# Patient Record
Sex: Female | Born: 1955
Health system: Southern US, Community
[De-identification: ages and names within clinical notes are randomized; demographics above are authoritative.]

## PROBLEM LIST (undated history)

## (undated) DIAGNOSIS — R569 Unspecified convulsions: Secondary | ICD-10-CM

## (undated) DIAGNOSIS — G4730 Sleep apnea, unspecified: Secondary | ICD-10-CM

## (undated) DIAGNOSIS — M359 Systemic involvement of connective tissue, unspecified: Secondary | ICD-10-CM

## (undated) DIAGNOSIS — I10 Essential (primary) hypertension: Secondary | ICD-10-CM

## (undated) HISTORY — PX: HERNIA REPAIR: SHX51

## (undated) HISTORY — PX: TUBAL LIGATION: SHX77

## (undated) HISTORY — DX: Sleep apnea, unspecified: G47.30

## (undated) HISTORY — PX: OTHER SURGICAL HISTORY: SHX169

## (undated) HISTORY — DX: Systemic involvement of connective tissue, unspecified: M35.9

## (undated) HISTORY — PX: CHOLECYSTECTOMY: SHX55

---

## 1998-04-12 ENCOUNTER — Encounter: Admission: RE | Admit: 1998-04-12 | Discharge: 1998-04-12 | Payer: Self-pay | Admitting: *Deleted

## 1998-07-23 ENCOUNTER — Ambulatory Visit (HOSPITAL_COMMUNITY): Admission: RE | Admit: 1998-07-23 | Discharge: 1998-07-23 | Payer: Self-pay | Admitting: Obstetrics and Gynecology

## 1998-07-23 ENCOUNTER — Encounter: Payer: Self-pay | Admitting: Obstetrics and Gynecology

## 1999-02-24 ENCOUNTER — Other Ambulatory Visit: Admission: RE | Admit: 1999-02-24 | Discharge: 1999-02-24 | Payer: Self-pay | Admitting: Obstetrics and Gynecology

## 1999-04-26 ENCOUNTER — Ambulatory Visit (HOSPITAL_COMMUNITY): Admission: RE | Admit: 1999-04-26 | Discharge: 1999-04-26 | Payer: Self-pay | Admitting: Obstetrics and Gynecology

## 1999-04-26 ENCOUNTER — Encounter (INDEPENDENT_AMBULATORY_CARE_PROVIDER_SITE_OTHER): Payer: Self-pay

## 2000-01-26 ENCOUNTER — Other Ambulatory Visit: Admission: RE | Admit: 2000-01-26 | Discharge: 2000-01-26 | Payer: Self-pay | Admitting: Obstetrics and Gynecology

## 2000-02-17 ENCOUNTER — Ambulatory Visit (HOSPITAL_COMMUNITY): Admission: RE | Admit: 2000-02-17 | Discharge: 2000-02-17 | Payer: Self-pay

## 2000-11-15 ENCOUNTER — Encounter: Payer: Self-pay | Admitting: Obstetrics and Gynecology

## 2000-11-15 ENCOUNTER — Ambulatory Visit (HOSPITAL_COMMUNITY): Admission: RE | Admit: 2000-11-15 | Discharge: 2000-11-15 | Payer: Self-pay | Admitting: Obstetrics and Gynecology

## 2001-02-21 ENCOUNTER — Inpatient Hospital Stay (HOSPITAL_COMMUNITY): Admission: EM | Admit: 2001-02-21 | Discharge: 2001-02-26 | Payer: Self-pay

## 2001-02-21 ENCOUNTER — Encounter: Payer: Self-pay | Admitting: Emergency Medicine

## 2001-04-04 ENCOUNTER — Other Ambulatory Visit: Admission: RE | Admit: 2001-04-04 | Discharge: 2001-04-04 | Payer: Self-pay | Admitting: Obstetrics and Gynecology

## 2001-05-20 ENCOUNTER — Ambulatory Visit (HOSPITAL_COMMUNITY): Admission: RE | Admit: 2001-05-20 | Discharge: 2001-05-20 | Payer: Self-pay | Admitting: *Deleted

## 2001-05-20 ENCOUNTER — Encounter: Payer: Self-pay | Admitting: *Deleted

## 2001-05-31 ENCOUNTER — Encounter: Payer: Self-pay | Admitting: *Deleted

## 2001-05-31 ENCOUNTER — Encounter: Admission: RE | Admit: 2001-05-31 | Discharge: 2001-05-31 | Payer: Self-pay | Admitting: *Deleted

## 2002-05-29 ENCOUNTER — Ambulatory Visit (HOSPITAL_COMMUNITY): Admission: RE | Admit: 2002-05-29 | Discharge: 2002-05-29 | Payer: Self-pay | Admitting: Obstetrics

## 2002-05-29 ENCOUNTER — Encounter: Payer: Self-pay | Admitting: Obstetrics

## 2002-06-18 ENCOUNTER — Encounter: Payer: Self-pay | Admitting: Cardiovascular Disease

## 2002-06-18 ENCOUNTER — Ambulatory Visit (HOSPITAL_COMMUNITY): Admission: RE | Admit: 2002-06-18 | Discharge: 2002-06-18 | Payer: Self-pay | Admitting: *Deleted

## 2002-07-30 ENCOUNTER — Ambulatory Visit (HOSPITAL_COMMUNITY): Admission: RE | Admit: 2002-07-30 | Discharge: 2002-07-30 | Payer: Self-pay | Admitting: Interventional Cardiology

## 2002-07-30 ENCOUNTER — Encounter: Payer: Self-pay | Admitting: Interventional Cardiology

## 2002-09-19 ENCOUNTER — Emergency Department (HOSPITAL_COMMUNITY): Admission: EM | Admit: 2002-09-19 | Discharge: 2002-09-19 | Payer: Self-pay | Admitting: *Deleted

## 2002-09-19 ENCOUNTER — Encounter: Payer: Self-pay | Admitting: *Deleted

## 2002-10-11 ENCOUNTER — Encounter: Payer: Self-pay | Admitting: *Deleted

## 2002-10-11 ENCOUNTER — Ambulatory Visit (HOSPITAL_COMMUNITY): Admission: RE | Admit: 2002-10-11 | Discharge: 2002-10-11 | Payer: Self-pay | Admitting: *Deleted

## 2003-07-30 ENCOUNTER — Ambulatory Visit (HOSPITAL_COMMUNITY): Admission: RE | Admit: 2003-07-30 | Discharge: 2003-07-30 | Payer: Self-pay | Admitting: Obstetrics

## 2004-01-03 ENCOUNTER — Emergency Department (HOSPITAL_COMMUNITY): Admission: EM | Admit: 2004-01-03 | Discharge: 2004-01-03 | Payer: Self-pay | Admitting: Family Medicine

## 2004-07-19 ENCOUNTER — Emergency Department (HOSPITAL_COMMUNITY): Admission: EM | Admit: 2004-07-19 | Discharge: 2004-07-19 | Payer: Self-pay | Admitting: Family Medicine

## 2004-08-11 ENCOUNTER — Ambulatory Visit (HOSPITAL_COMMUNITY): Admission: RE | Admit: 2004-08-11 | Discharge: 2004-08-11 | Payer: Self-pay | Admitting: Obstetrics

## 2005-12-27 ENCOUNTER — Ambulatory Visit (HOSPITAL_COMMUNITY): Admission: RE | Admit: 2005-12-27 | Discharge: 2005-12-27 | Payer: Self-pay | Admitting: Internal Medicine

## 2006-07-13 ENCOUNTER — Emergency Department (HOSPITAL_COMMUNITY): Admission: EM | Admit: 2006-07-13 | Discharge: 2006-07-13 | Payer: Self-pay | Admitting: Emergency Medicine

## 2006-07-13 IMAGING — CR DG CERVICAL SPINE COMPLETE 4+V
5 series · 5 of 5 positions shown · non-contrast
Comparison: 10/11/02.

CLINICAL DATA: 50 year-old-female with trauma and posterior neck pain. 
 CERVICAL SPINE ? _ VIEW:
 AP, lateral, oblique and odontoid views of the cervical spine were obtained.

[w c-spine lat]
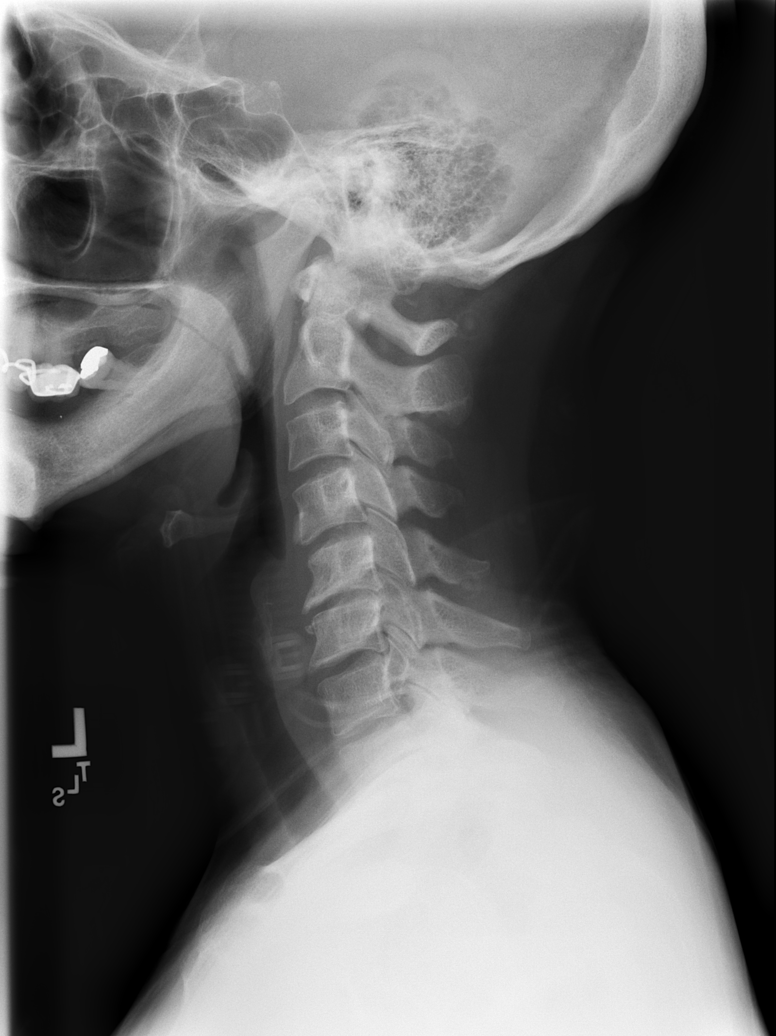

[w c-spine oblique (1 of 2)]
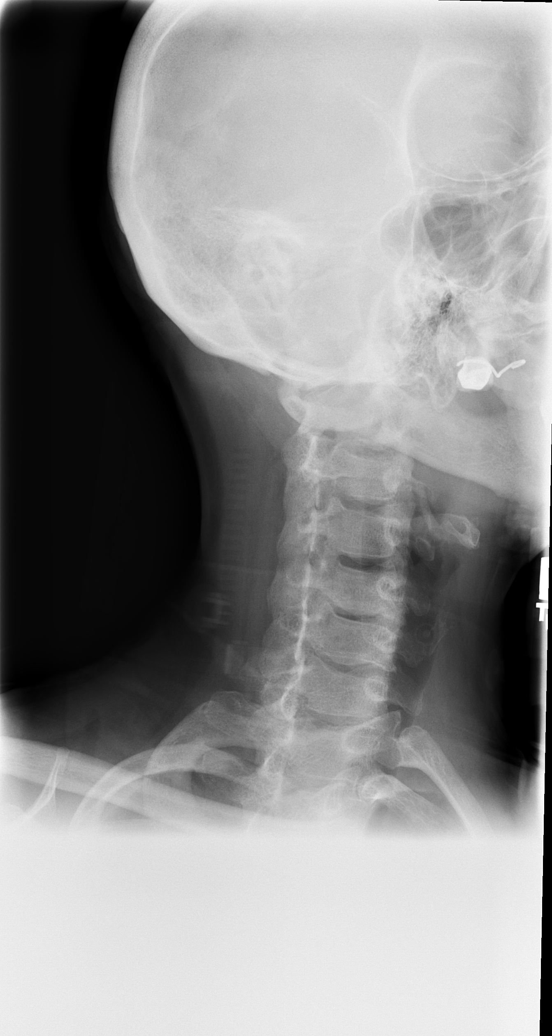

[w c-spine oblique (2 of 2)]
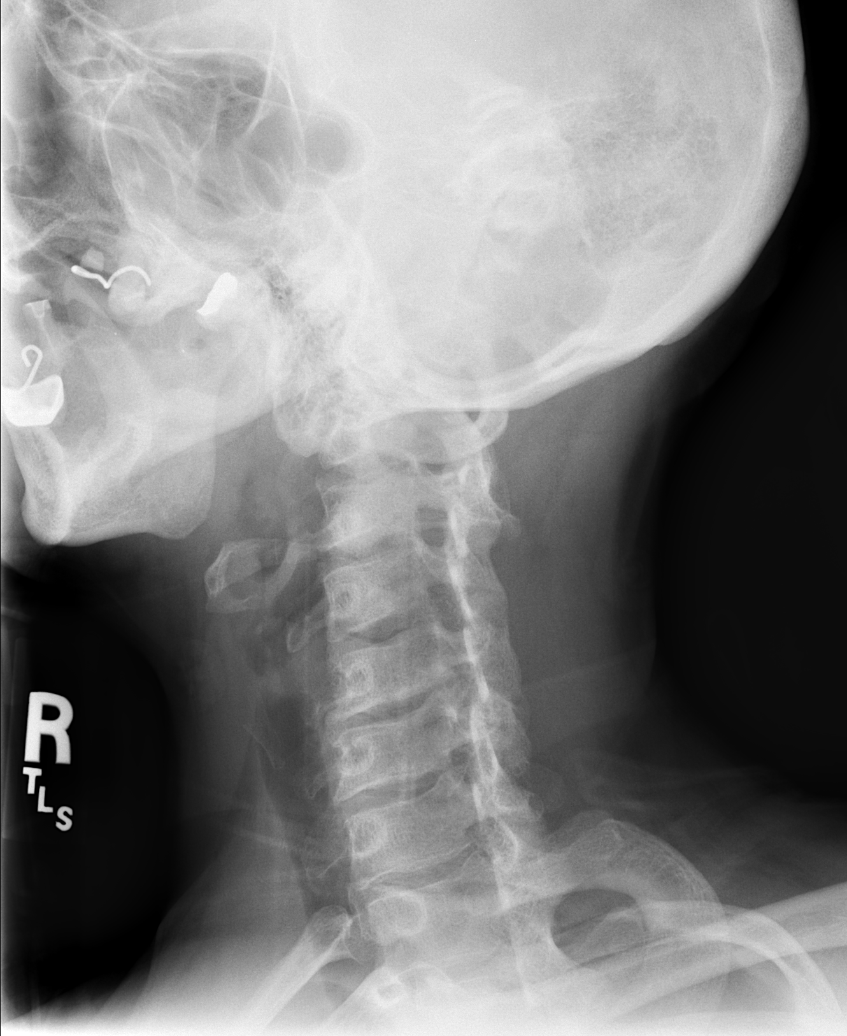

[w c-spine a.p.]
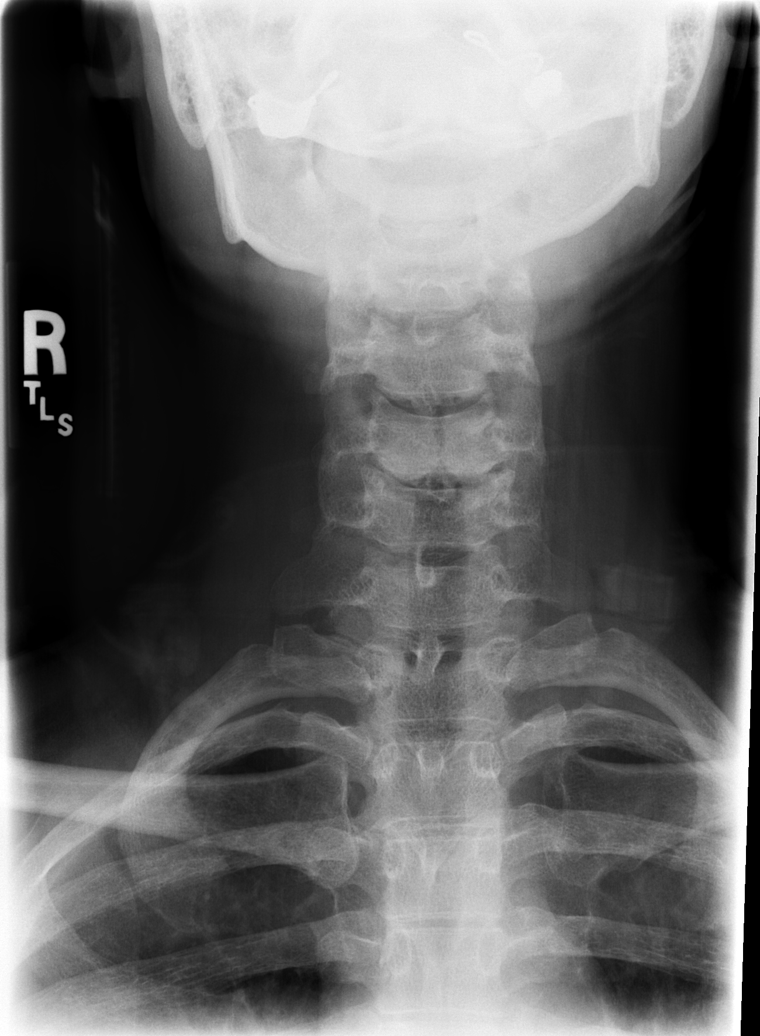

[w c-spine odontoid]
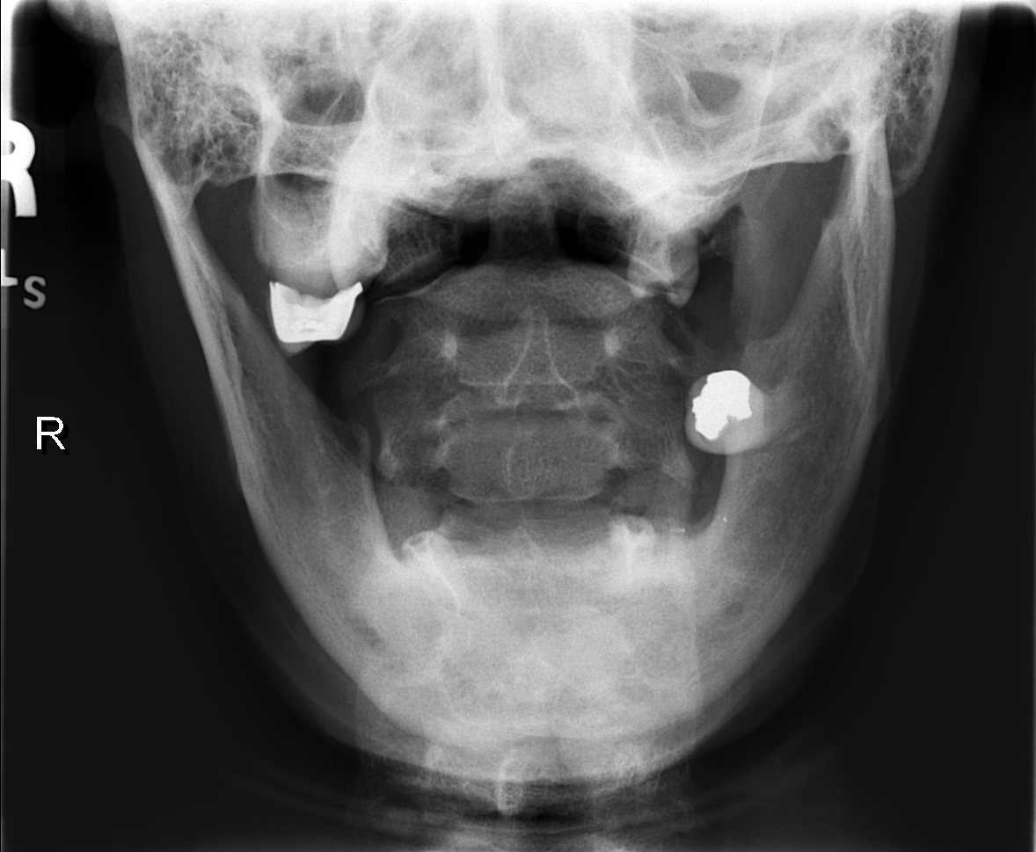

[5 of 5 positions shown; findings below may reference images not displayed]

FINDINGS: Alignment of the cervical spine is unchanged with degenerative endplate changes at C5-C6.  Prevertebral soft tissues are within normal limits.  Possible bony encroachment along the left foramen at C5-C6.
IMPRESSION: Stable cervical spine series with degenerative changes at C5-C6.  No acute bone abnormalities.

## 2006-07-23 ENCOUNTER — Encounter: Admission: RE | Admit: 2006-07-23 | Discharge: 2006-07-23 | Payer: Self-pay | Admitting: Internal Medicine

## 2006-07-23 IMAGING — US US ABDOMEN COMPLETE
1 series · 14 of 25 positions shown · non-contrast
Comparison: None.

ABDOMEN ULTRASOUND:

CLINICAL DATA: Elevated LFTs
TECHNIQUE: Complete abdominal ultrasound examination was performed including
evaluation of the liver, gallbladder, bile ducts, pancreas, kidneys, spleen,
IVC, and abdominal aorta.

[Series 1: unknown · 14 of 58 slices shown]
[im 1/58]
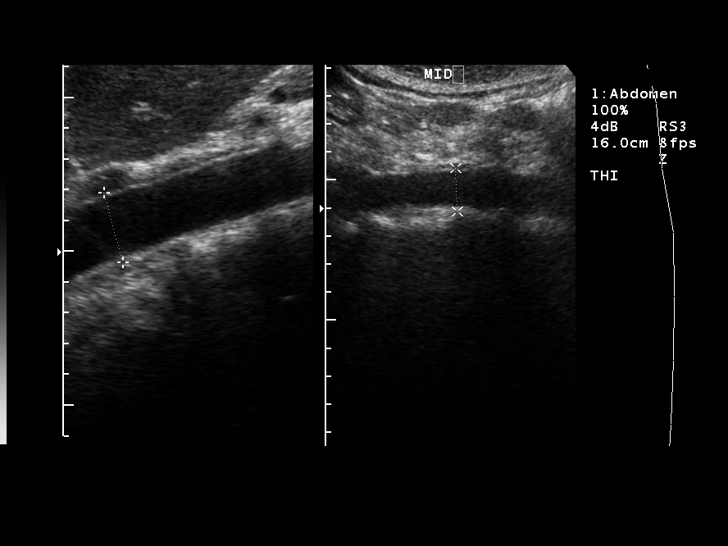
[im 5/58]
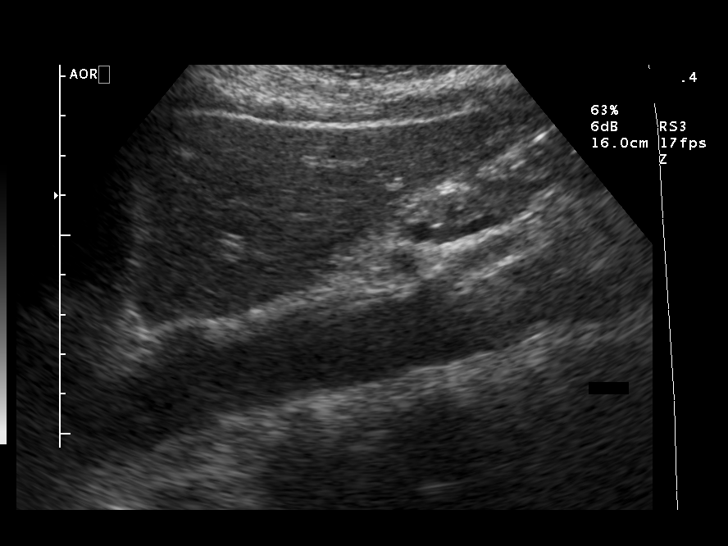
[im 10/58]
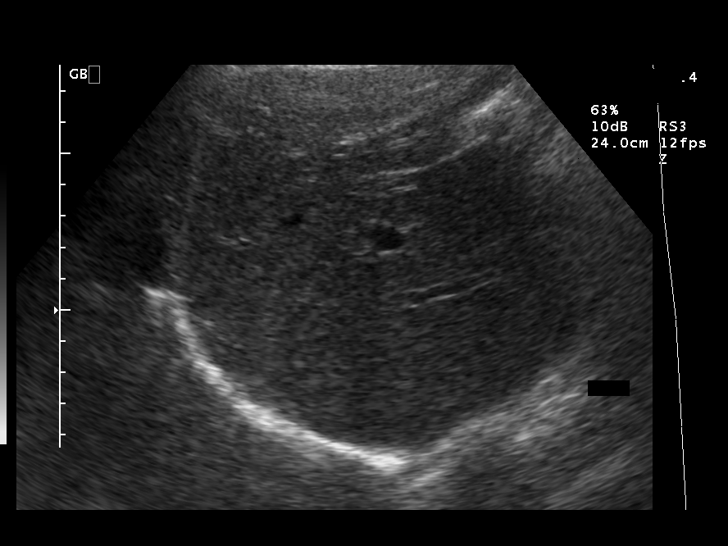
[im 15/58]
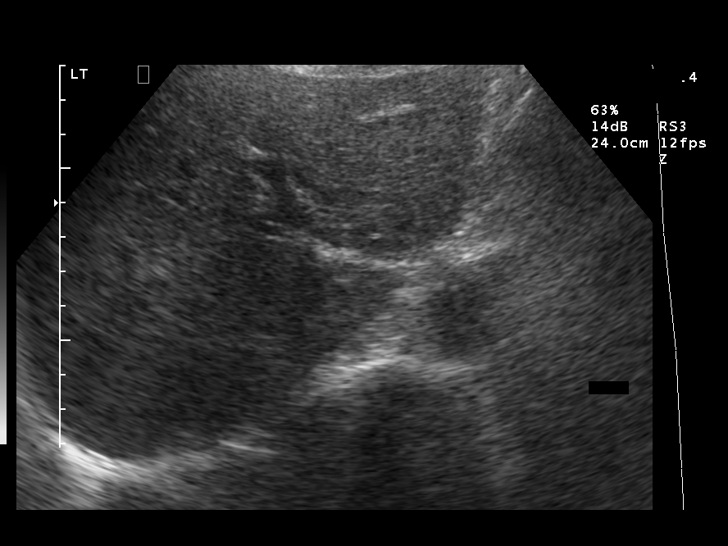
[im 20/58]
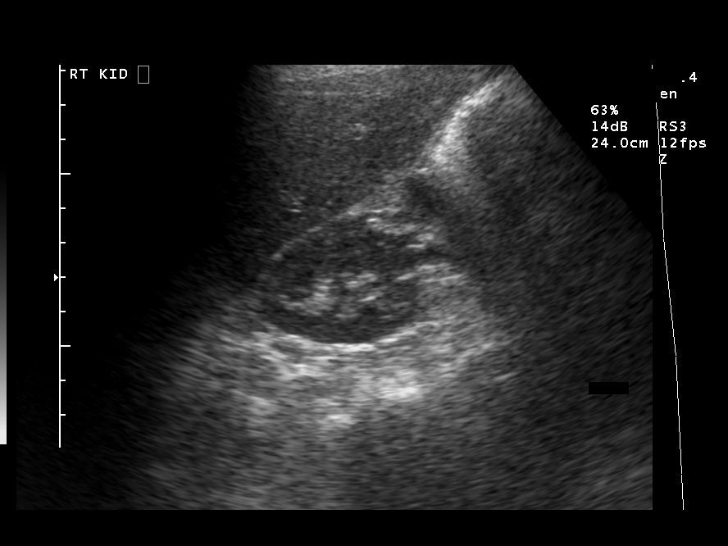
[im 22/58]
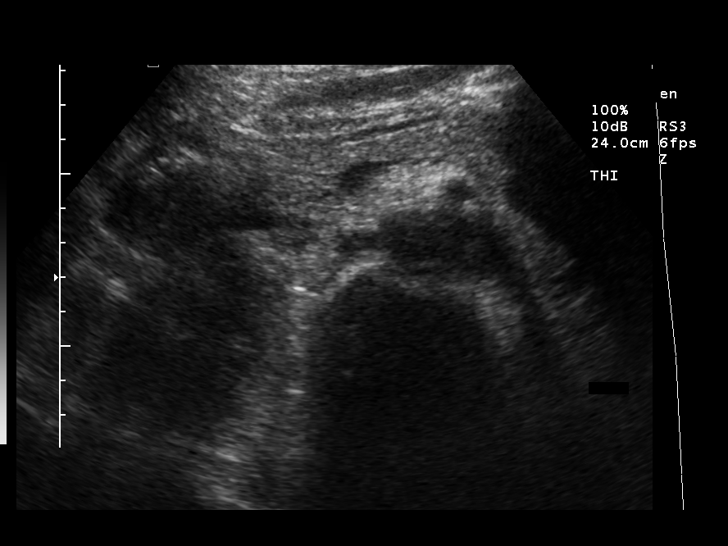
[im 27/58]
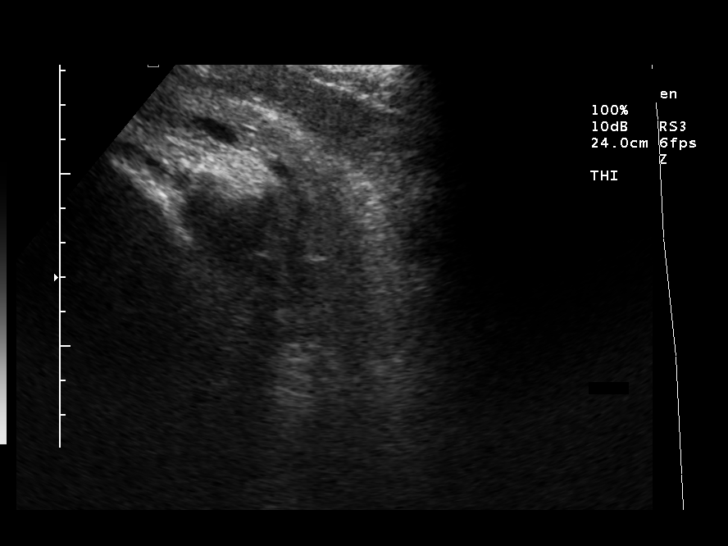
[im 31/58]
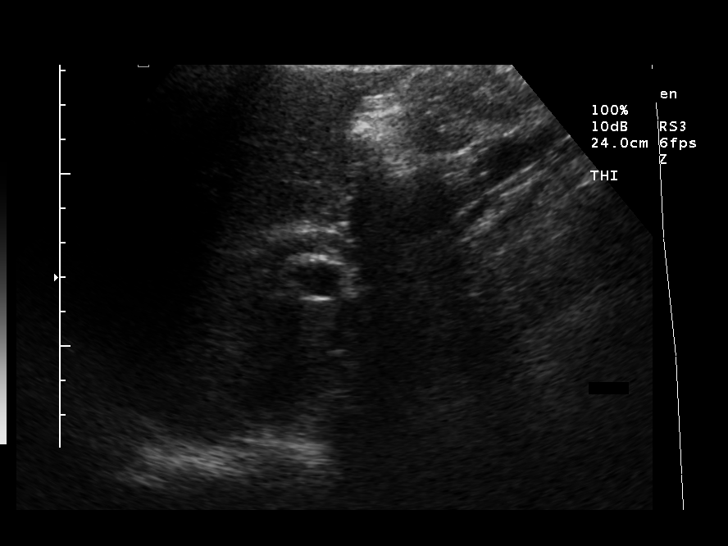
[im 36/58]
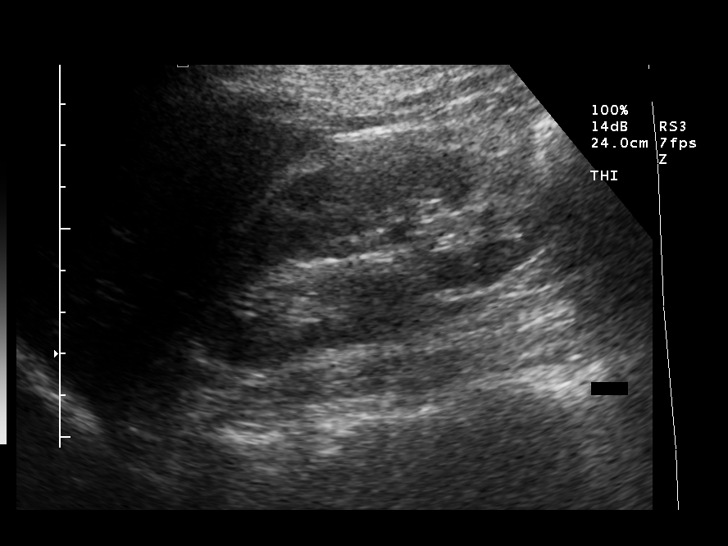
[im 39/58]
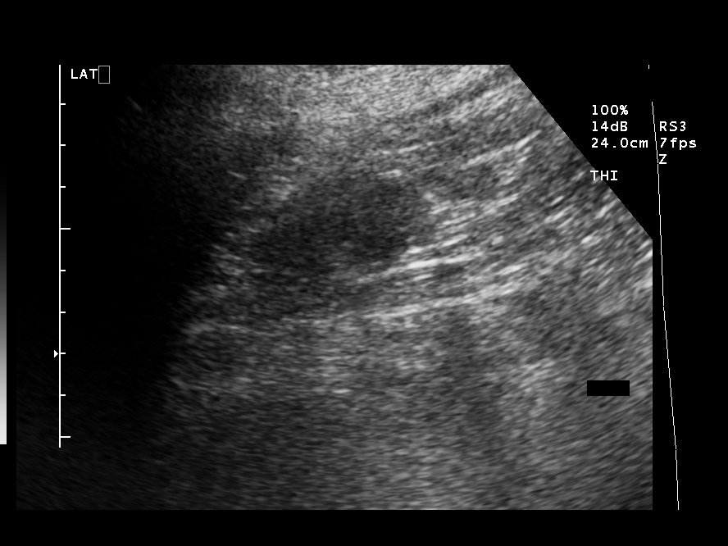
[im 43/58]
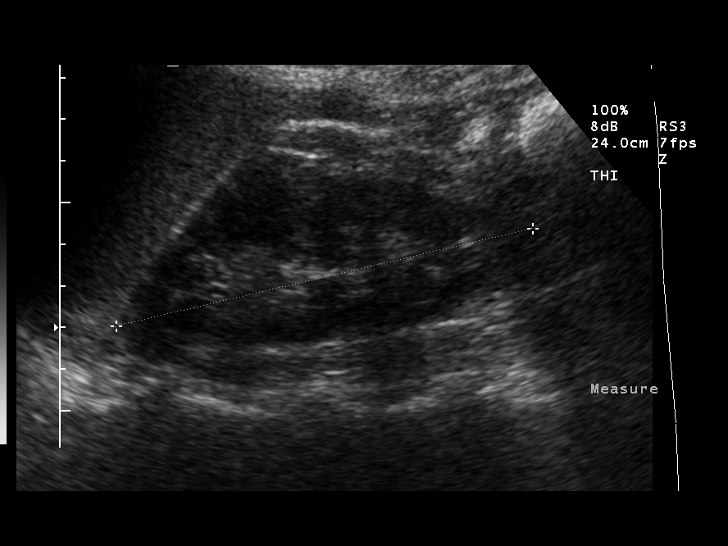
[im 48/58]
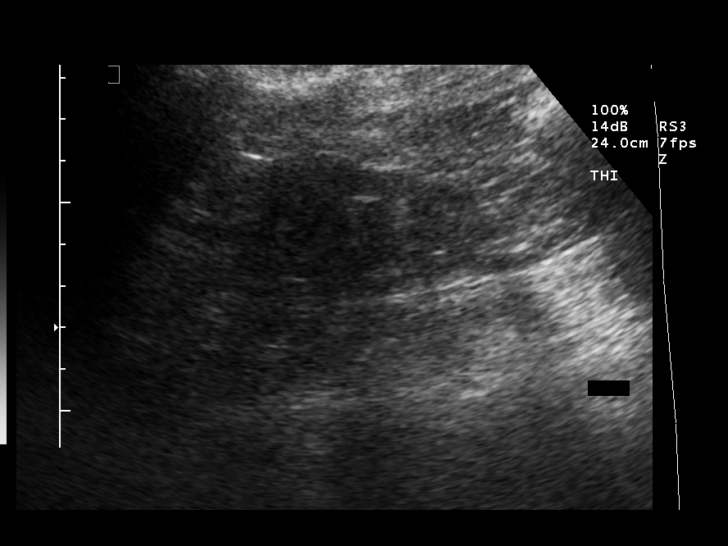
[im 53/58]
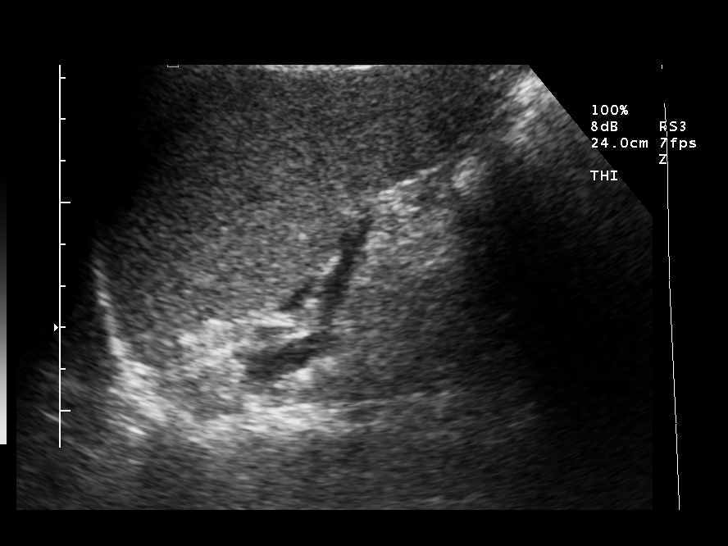
[im 58/58]
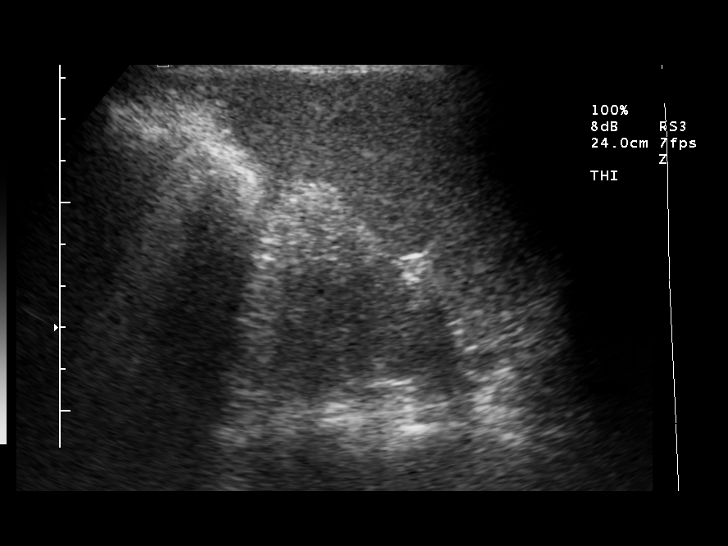

[14 of 25 positions shown; findings below may reference images not displayed]

FINDINGS: Gallbladder: Surgically absent

Common Bile Duct:  Upper normal at 6 mm.

Liver:  Normal

Inferior Vena Cava:  Normal

Pancreas:  Pancreatic tail is not well seen. The remaining parenchyma is
unremarkable.

Spleen:  Normal

Right Kidney:  Normal

Left Kidney:  Normal

Aorta:  No aneurysm
IMPRESSION: Status post cholecystectomy. Otherwise unremarkable exam.

## 2006-08-13 ENCOUNTER — Encounter (HOSPITAL_COMMUNITY): Admission: RE | Admit: 2006-08-13 | Discharge: 2006-08-22 | Payer: Self-pay | Admitting: Internal Medicine

## 2008-06-17 ENCOUNTER — Encounter: Admission: RE | Admit: 2008-06-17 | Discharge: 2008-06-17 | Payer: Self-pay | Admitting: Internal Medicine

## 2009-07-22 ENCOUNTER — Ambulatory Visit (HOSPITAL_COMMUNITY): Admission: RE | Admit: 2009-07-22 | Discharge: 2009-07-22 | Payer: Self-pay | Admitting: Obstetrics

## 2009-08-04 ENCOUNTER — Encounter: Admission: RE | Admit: 2009-08-04 | Discharge: 2009-08-04 | Payer: Self-pay | Admitting: Obstetrics

## 2010-10-02 ENCOUNTER — Encounter: Payer: Self-pay | Admitting: Obstetrics

## 2010-10-02 ENCOUNTER — Encounter: Payer: Self-pay | Admitting: Internal Medicine

## 2011-01-27 NOTE — Consult Note (Signed)
NAME:  [name], [name]                            ACCOUNT NO.:  [id number]   MEDICAL RECORD NO.:  [id number]                   PATIENT TYPE:  OIB   LOCATION:  2875                                 FACILITY:  [facility]   PHYSICIAN:  Bernette Redbird, M.D.                DATE OF BIRTH:  01-04-1956   DATE OF CONSULTATION:  07/30/2002  DATE OF DISCHARGE:                                   CONSULTATION   REASON FOR CONSULTATION:  Dr. Meade Maw, covering for Dr. Lyn Records, asked me to see this pleasant 55 year old African American female  because of questionable pancreatitis.  The patient is currently in the short-  stay unit following a normal cardiac catheterization earlier today by Dr.  Lyn Records performed because of an echocardiogram which had raised the  question of localized wall motion abnormalities and possibly a previous  infarct.   From time to time, including prior to the test but also since the test, the  patient has had pain in the high epigastric or subxiphoid region, pain in  the center of her back, and/or chest tightness.  These different areas of  symptoms can occur independently and do not seem to be associated with  meals.  There are no obvious relieving medications.   Because of the presence of such symptoms following the catheterization, Dr.  Meade Maw was notified and ordered blood work which included a normal  hepatic function panel other than minimal elevation of alkaline phosphate,  but a mild to moderate elevation of amylase was noted (lipase normal).  Dr.  Meade Maw called and asked me to help guide the evaluation of the  patient.   Note, that the patient states she was hospitalized for pancreatitis last  year for several days.  Apparently, the etiology of the pancreatitis was  never identified.  The patient is remotely status post cholecystectomy about  seven years ago, is a nondrinker, but is on thiazide diuretics.   The patient does not seem  to be all significantly compromised by the current  symptoms and in fact ate a full dinner without any exacerbation of symptoms.   ALLERGIES:  DILANTIN and LIBRIUM.   MEDICATIONS:  Tegretol, Triamterine/hydrochlorothiazide, Plaquenil,  Cataflam, aspirin, Slow Iron, calcium, and multivitamin.   PAST SURGICAL HISTORY:  1. Remote tubal ligation.  2. Inguinal herniorrhaphy.  3. Cholecystectomy.   PAST MEDICAL HISTORY:  1. Mixed connective tissue disease, followed by Dr. Lemmie Evens.  2. Epilepsy since age 33.  3. No prior history of pancreatitis.   HABITS:  Previous smoker.  Nondrinker.   FAMILY HISTORY:  Negative for pancreatitis but positive for ulcers in her  mother and her daughter had her gallbladder removed but apparently this was  in the context of some trauma from a car accident.  No family history of  liver disease or colon cancer.   SOCIAL HISTORY:  Married.  Works at [employer] in various  capacities.   REVIEW OF SYMPTOMS:  As noted, the patient has had periodic pain in her  back.  Note, that when she had her pancreatitis, the symptoms she  experienced where much more severe than her current symptoms, although they  were similar in location and to some degree in terms of character.  However,  at that time, the patient was not able to eat and in fact apparently had a  poor appetite.   PHYSICAL EXAMINATION:  GENERAL:  The patient is a healthy-appearing African  American female in no evident distress whatsoever, sitting up on the side of  the bed, dressed, and ready to go home from the short-stay unit.  She is  anicteric.  ABDOMEN:  Active bowel sounds and is without significant  tenderness, although the patient does report some subjective tenderness to  palpation of the subxiphoid area.  No mass-effect is appreciated.   LABORATORY DATA:  White count is low at 3000, hemoglobin 11.5, MCV 89,  platelet count 215,000.  Hepatic function panel within normal  limits except  for alkaline phosphate of 154, normal up to 117.  Amylase is mildly elevated  at 188 (apparently this value was corrected to 194 subsequently) with normal  being up 131 and the lipase is normal at 24 (normal 22 to 51, revised value  reported as 23).   IMPRESSION:  1. Current chief complaint in the center of her back.  Also, periodic     symptoms of high epigastric pain and chest tightness.  2. History of some nausea this evening but able to eat okay and keep it     down.  3. Prior apparent history of pancreatitis last year, at which time there was     fluid around the pancreas on the CT scan.  Her amylase was elevated to     259 and her liver chemistries were elevated as well.  4. Mixed connective tissue disease.  5. Nonspecific chest pain with abnormal echocardiogram but no correlative     abnormalities on today's cardiac catheterization.   DISPOSITION:  I am quite sure that the patient does not have current active  pancreatitis based on the normal lipase level, the absence of any food  intolerance or exacerbation of symptoms by food, the normal white count, the  relatively benign abdominal exam, and simply the patient's overall sense of  well being.  She certainly does not feel the need to come into the hospital  tonight and I see no criteria for admission.  There is mild hyperamylasemia  but this could be due to any of the number factors including possibly  salivary gland amylase.  It is conceivable the patient has minimal  pancreatitis, subclinical, just enough to elevate one of her pancreatic  enzymes.   The patient does have two possible risk factors for pancreatitis,  specifically a prior history of biliary tract disease, raising the question  of a possible common duct stone, that sounds in particular quite compatible  with the clinical presentation she had last year.  The other possibility would be thiazide-induced pancreatitis.  The patient is currently on   hydrochlorothiazide.   RECOMMENDATIONS:  1. As noted, I think it is okay for the patient to go home this evening.  No     special precautions or interventions are needed but I did advise the     patient to be eat lightly for the next few days, avoiding  particularly     large or greasy meals.  2. Since the patient's primary physician is a gastroenterologist, I have     encouraged her to contact him in the event of worsening abdominal pain.  3. Her gastroenterologist may want to consider imaging of the common bile     duct, specifically with a MRCP to help rule out choledocholithiasis.  4. Her gastroenterologist may want to consider follow-up blood work testing     to see if the elevation of amylase is a persistent or an intermittent     finding.  5. If clinical uncertainty persists regarding whether or not pancreatitis is     present, consideration could also be given to a repeat CT scan.  6. As long as the question of pancreatitis is in the picture, it might be     helpful to substitute some other medication for the hydrochlorothiazide     which the patient is currently receiving, simply to take out possible     element of causation out of the picture.  7. For nonspecific epigastric pain, especially in a patient on aspirin and     iron, consideration might be given to initiation of empiric proton pump     inhibitor therapy in case there is a reflux component or some acid peptic     component to her symptoms.  8. I appreciate the opportunity to have seen this patient in consultation.                                               Bernette Redbird, M.D.    RB/MEDQ  D:  07/30/2002  T:  07/30/2002  Job:  [id number]   cc:   Lesleigh Noe, M.D.  [address]  [address]  [place]  [place] [address]  Fax: [phone]   Sharyn Dross., M.D.  [address]  [address]  [place]  [place] [address]  Fax: [phone]

## 2011-01-27 NOTE — Cardiovascular Report (Signed)
NAME:  [name], [name]                            ACCOUNT NO.:  [id number]   MEDICAL RECORD NO.:  [id number]                   PATIENT TYPE:  OIB   LOCATION:  2875                                 FACILITY:  [facility]   PHYSICIAN:  Lesleigh Noe, M.D.            DATE OF BIRTH:  [birth year]   DATE OF PROCEDURE:  DATE OF DISCHARGE:                              CARDIAC CATHETERIZATION   INDICATIONS FOR PROCEDURE:  The patient in September had recurring episodes  of chest tightness and dyspnea, made worse by exertion. An echocardiogram  demonstrated an irregular apical wall motion abnormality as dictated by Dr.  Charlton Haws. Because of the abnormality noted on echocardiogram and her  symptoms, it was felt that diagnostic coronary angiography was indicated to  define the coronary anatomy and help guide therapy.   PROCEDURE:  1. Left heart catheterization.  2. Selective coronary angiography.  3. Left ventriculography.   DESCRIPTION OF PROCEDURE:  After informed consent, a #6 French sheath was  placed in the right femoral artery using a modified Seldinger technique. A  #6 French A2 multipurpose catheter was then used for hemodynamic recordings,  left ventriculography by hand injection, and selective right coronary  angiography. A #4 6 French left Judkins' catheter was used for left coronary  angiography. The patient tolerated the procedure without complications.   RESULTS:  A. Hemodynamic data:     a. Aortic pressure 161/90.     b. Left ventricular pressure 170/18.  B. Left ventriculography:     a. The left ventricle demonstrates normal overall contractility. The        ejection fraction is in the 55-65% range. No obvious regional wall        motion abnormality is noted.  C. Selective coronary angiography.     a. Left main coronary: The left main is long, smooth, and free of any        significant obstruction.     b. Left anterior descending coronary: LAD is large. Wraps around the  left        ventricular apex. Gives origin to no significant diagonal branches and        wraps around the left ventricular apex. Minimal luminal irregularity        with up to perhaps 25% narrowing noted in the osteal LAD.     c. Ramus branch: The large ramus branch arises from the distal left main        and is free of any obstruction. This is a branching vessel.     d. Circumflex artery: The circumflex artery is moderate in size, given        origin to obtuse marginal branches. No significant obstruction is        noted.     e. Right coronary: The right coronary artery is large, free of any        obstruction, gives a large  PDA and left ventricular branches.   CONCLUSION:  1. Essentially normal coronary arteries.  2. Normal left ventricular function.  3. Recent echocardiogram demonstrating abnormal apical abnormality that is     no longer present. This raises the possibility of __ muscle due either to     transient LAD thrombosis with recannulization and coronary artery spasm.     Given the patient's history of mixed connective tissue disease, it is     possible that she could have had in situ thrombosis or coronary spasm.   RECOMMENDATIONS:  Clinical follow-up. No specific therapy other than as  needed Nitroglycerin for recurrent chest discomfort or either the  possibility of coronary artery spasm.                                                Lesleigh Noe, M.D.    HWS/MEDQ  D:  07/30/2002  T:  07/30/2002  Job:  [id number]   cc:   Dorise Hiss, M.D.  [address]  [place]  [place] [address]  Fax: [phone]

## 2011-01-27 NOTE — Discharge Summary (Signed)
[place]  Patient:    [name], [name]                       MRN: [id number] Adm. Date:  [id number] Disc. Date: 02/26/01 Attending:  Sharyn Dross                           Discharge Summary  ADMITTING DIAGNOSES: 1. Abnormal liver enzymes. 2. Abdominal pain consistent with pancreatitis.  DISCHARGE DIAGNOSES: 1. Acute pancreatitis, improved. 2. Abnormal liver enzymes, improved.  CONDITION ON DISCHARGE:  Stable and improved.  DISCHARGE MEDICATIONS:  Resume previous home medications.  CONSULTATIONS:  None.  COMPLICATIONS:  None.  DIET:  Low fat diet for the next three days, then resume previous diet.  FOLLOWUP:  Follow up with me in one week.  HOSPITAL COURSE:  The patient was admitted into the hospital with acute abdominal pains which was radiating into the back area at this time, especially when she lies down.  Her amylase levels were initially presently not available, but the liver enzymes that were present were markedly elevated at this time.  She was admitted into the hospital to evaluate her symptoms as well as to check her amylase levels to ensure she did not have pancreatitis. The results of the amylase levels came back at 259 that was initially noted. The patients symptoms still persisted with abdominal pains requiring parenteral analgesics to help these symptoms at this time.  She did relatively well with the analgesic medications without any further vomiting that was present and the pain started showing gradual evidence of improvement at this time.  Over the next several days, the levels of her liver enzymes and her amylase level gradually started showing evidence of improvement.  The results of the last studies of the amylase levels were approximately 150-157 that was noted.  The liver enzymes had corrected back towards normal with Alk phos still slightly elevated at 212 that is noted.  The patient did not develop any icteric  sclerae and her bilirubins did not rise in the process.  Today, the patient is feeling fine without any pains or discomfort that are noted at this point.  She has not had analgesic medications over the last 48 hours and she feels good enough that she can possibly go home at this point.  The patient will be discharged to home today to resume her previous home medications, to start on a low fat diet over the next three days and then resume her previous diet and she will follow up back with me in a week.  At that time, we will recheck her labs to ensure that they are back to normal at that point. DD:  02/26/01 TD:  02/26/01 Job: [id number]

## 2011-01-27 NOTE — H&P (Signed)
[place]  Patient:    [name], [name]                       MRN: [id number] Adm. Date:  [id number] Attending:  Sharyn Dross                         History and Physical  HISTORY OF PRESENT ILLNESS:  Subjectively, this pleasant 55 year old black female was admitted into the hospital with acute abdominal pains at this time. The patient states that the symptoms occurred one day prior to admission, at this time when the patient was seen by an oral surgeon and given medication. She states in the evening time that she started having abdominal discomforts and appeared to be in the epigastric area, radiating towards the back region at this time.  She states that she gets relief when she lies down but the pains are more in the back area at that time.  When she woke up this morning, the patient states that the pains were still present at that time and she came to the emergency room for evaluation.  She denies any history of any hematemesis or any history of any emesis that was noted and there was no history of any melena or hematochezia or problems with her bowels at this point.  She denies any abdominal distention and there was no history of any peptic ulcer disease or any gallbladder or previous liver disease that were present.  She also denies any history of any pancreatitis that is noted at this time.  She came to the emergency room for an evaluation, where they did labs and they found that the patient had elevated liver enzymes that were present and a markedly elevated lipase level.  The amylase level is presently not available at this time.  CURRENT MEDICATIONS:  The patient currently takes Tegretol as well as Tylenol No. 3s as needed for pains and discomforts that are present at this time.  REVIEW OF SYSTEMS:  Positive for a history of seizures and sinusitis problems that are present.  PHYSICAL EXAMINATION:  GENERAL:  She is a pleasant  female who appears to be in no acute distress.  VITAL SIGNS:  Her blood pressure is 152/90.  Pulse rate 79.  Respiratory rate was 18.  Temperature 98.4.  HEENT:  Examination shows slightly greenish-yellowish-colored sclerae that are present at this time but this is visualized under fluorescent lights and I am unsure of this characteristic.  NECK:  Supple.  LUNGS:  Clear to auscultation.  HEART:  Regular rate and rhythm, without heaves, thrills, murmurs or gallops.  ABDOMEN:  Soft.  Positive tenderness to palpation in the epigastric and supraumbilical region; no infraumbilical tenderness is noted.  Decreased bowel sounds appreciated.  No asymmetry noted at this time.  EXTREMITIES:  No clubbing, cyanosis, or edema that are present.  LABORATORY DATA: 1. Elevated alkaline phosphatase of approximately 238 that is noted. 2. Elevated SGOT and SGPTs in 500s and 250 range, respectively at this time. 3. Coagulation studies were not available. 4. Urinalysis appeared to be unremarkable at this time.  GROSS IMPRESSION: 1. Elevated liver enzymes possibly secondary to hepatocellular disease,    etiology unknown. 2. Elevated lipase level, which was told to me by emergency department    physician, possibly related to pancreatitis, etiology presently unknown.  PRESENT RECOMMENDATIONS:  I am presently going to admit the patient into our hospital to  get adequate assessment of her labs at this time, conservative management during the interim.  Will use morphine sulfate on an intravenous basis p.Leonard.n. for pain at this time, until we get some confirming labs to help with her evaluation and depending upon the results, will determine the course of therapy. DD:  02/21/01 TD:  02/21/01 Job: [id number]

## 2011-01-30 ENCOUNTER — Other Ambulatory Visit: Payer: Self-pay | Admitting: Internal Medicine

## 2011-01-30 DIAGNOSIS — Z1231 Encounter for screening mammogram for malignant neoplasm of breast: Secondary | ICD-10-CM

## 2011-02-03 ENCOUNTER — Ambulatory Visit: Payer: Self-pay

## 2011-04-13 ENCOUNTER — Other Ambulatory Visit (HOSPITAL_COMMUNITY): Payer: Self-pay | Admitting: Obstetrics

## 2011-04-13 DIAGNOSIS — Z1231 Encounter for screening mammogram for malignant neoplasm of breast: Secondary | ICD-10-CM

## 2011-04-17 ENCOUNTER — Ambulatory Visit (HOSPITAL_COMMUNITY)
Admission: RE | Admit: 2011-04-17 | Discharge: 2011-04-17 | Disposition: A | Discharge status: Home / Self Care | Payer: Self-pay | Source: Ambulatory Visit | Attending: Obstetrics | Admitting: Obstetrics

## 2011-04-17 DIAGNOSIS — Z1231 Encounter for screening mammogram for malignant neoplasm of breast: Secondary | ICD-10-CM | POA: Insufficient documentation

## 2012-03-25 ENCOUNTER — Emergency Department (HOSPITAL_COMMUNITY)
Admission: EM | Admit: 2012-03-25 | Discharge: 2012-03-25 | Disposition: A | Discharge status: Home / Self Care | Payer: Self-pay | Source: Home / Self Care | Attending: Emergency Medicine | Admitting: Emergency Medicine

## 2012-03-25 ENCOUNTER — Encounter (HOSPITAL_COMMUNITY): Payer: Self-pay | Admitting: Emergency Medicine

## 2012-03-25 DIAGNOSIS — J029 Acute pharyngitis, unspecified: Secondary | ICD-10-CM

## 2012-03-25 DIAGNOSIS — J329 Chronic sinusitis, unspecified: Secondary | ICD-10-CM

## 2012-03-25 HISTORY — DX: Essential (primary) hypertension: I10

## 2012-03-25 LAB — POCT RAPID STREP A: Streptococcus, Group A Screen (Direct): NEGATIVE

## 2012-03-25 MED ORDER — AMOXICILLIN 500 MG PO CAPS: 500.0000 mg | ORAL_CAPSULE | Freq: Three times a day (TID) | ORAL | Status: AC

## 2012-03-25 MED ORDER — HYDROCODONE-ACETAMINOPHEN 5-325 MG PO TABS: 2.0000 | ORAL_TABLET | ORAL | Status: AC | PRN

## 2012-03-25 NOTE — ED Notes (Signed)
PT HERE WITH C/O SORE THROAT,DIFF SWALLOWING AND RADIATING PAIN TO BOTH EARS THAT STARTED LAST Wednesday.SLIGHT COUGH WITH NAUSEA ALSO REPORTED.DENIES FEVER,VOMITING

## 2012-03-25 NOTE — ED Provider Notes (Signed)
History     CSN: [id number]  Arrival date & time 03/25/12  1446   None     Chief Complaint  Patient presents with  . Sore Throat    (Consider location/radiation/quality/duration/timing/severity/associated sxs/prior treatment) Patient is a 56 y.o. female presenting with pharyngitis. The history is provided by the patient. No language interpreter was used.  Sore Throat This is a new problem. The current episode started 2 days ago. The problem occurs constantly. The problem has been gradually worsening. Associated symptoms include headaches. Nothing relieves the symptoms.  Pt complains of a headahce, sinus pressure drainage, ear pain and sore throat  Past Medical History  Diagnosis Date  . Hypertension     Past Surgical History  Procedure Date  . Cholecystectomy   . Tubal ligation     No family history on file.  History  Substance Use Topics  . Smoking status: Never Smoker   . Smokeless tobacco: Not on file  . Alcohol Use: No    OB History    Grav Para Term Preterm Abortions TAB SAB Ect Mult Living                  Review of Systems  HENT: Positive for congestion and sore throat.   Neurological: Positive for headaches.    Allergies  Dilantin and Librium  Home Medications   Current Outpatient Rx  Name Route Sig Dispense Refill  . CARBAMAZEPINE ER 400 MG PO TB12 Oral Take 400 mg by mouth 2 (two) times daily.      BP 123/74  Pulse 83  Temp 99.3 F (37.4 C) (Oral)  Resp 18  SpO2 100%  Physical Exam  Nursing note and vitals reviewed. Constitutional: She appears well-developed.  HENT:  Head: Normocephalic and atraumatic.  Right Ear: External ear normal.  Left Ear: External ear normal.  Nose: Nose normal.       Erythematous throat, tender maxillary sinuses  Eyes: Conjunctivae and EOM are normal. Pupils are equal, round, and reactive to light.  Neck: Normal range of motion. Neck supple.  Cardiovascular: Normal rate and regular rhythm.     Pulmonary/Chest: Effort normal.  Musculoskeletal: Normal range of motion.  Neurological: She is alert.  Skin: Skin is warm.  Psychiatric: She has a normal mood and affect.    ED Course  Procedures (including critical care time)   Labs Reviewed  POCT RAPID STREP A ([facility]RG CARE ONLY)   No results found.   1. Sinus infection   2. Sorethroat       MDM  amoxicillian  See Dr. Concepcion Elk for recheck in 3-4 days.       Lonia Skinner [place], [place] 03/25/12 1659

## 2012-03-25 NOTE — ED Provider Notes (Signed)
Medical screening examination/treatment/procedure(s) were performed by non-physician practitioner and as supervising physician I was immediately available for consultation/collaboration.  Leslee Home, M.D.   Reuben Likes, MD 03/25/12 2124

## 2013-03-31 DIAGNOSIS — G40209 Localization-related (focal) (partial) symptomatic epilepsy and epileptic syndromes with complex partial seizures, not intractable, without status epilepticus: Secondary | ICD-10-CM | POA: Insufficient documentation

## 2013-07-04 ENCOUNTER — Other Ambulatory Visit: Payer: Self-pay | Admitting: Obstetrics and Gynecology

## 2013-07-04 DIAGNOSIS — Z1231 Encounter for screening mammogram for malignant neoplasm of breast: Secondary | ICD-10-CM

## 2013-07-22 ENCOUNTER — Ambulatory Visit (HOSPITAL_COMMUNITY)
Admission: RE | Admit: 2013-07-22 | Discharge: 2013-07-22 | Disposition: A | Discharge status: Home / Self Care | Payer: Self-pay | Source: Ambulatory Visit | Attending: Obstetrics and Gynecology | Admitting: Obstetrics and Gynecology

## 2013-07-22 ENCOUNTER — Encounter (HOSPITAL_COMMUNITY): Payer: Self-pay

## 2013-07-22 VITALS — BP 122/78 | Temp 98.8°F | Ht 65.5 in | Wt 163.8 lb

## 2013-07-22 DIAGNOSIS — Z1239 Encounter for other screening for malignant neoplasm of breast: Secondary | ICD-10-CM

## 2013-07-22 DIAGNOSIS — Z1231 Encounter for screening mammogram for malignant neoplasm of breast: Secondary | ICD-10-CM

## 2013-07-22 HISTORY — DX: Unspecified convulsions: R56.9

## 2013-07-22 NOTE — Progress Notes (Signed)
No complaints today.  Pap Smear:    Pap smear not completed today. Last Pap smear was October 2012 at the free [facility] at [place] and normal per patient. Per patient has a history of an abnormal Pap smear in the early 1990's that a colposcopy and cryotherapy was completed for follow up. No Pap smear results in EPIC.  Physical exam: Breasts Breasts symmetrical. No skin abnormalities bilateral breasts. No nipple retraction bilateral breasts. No nipple discharge bilateral breasts. No lymphadenopathy. No lumps palpated bilateral breasts. No complaints of pain or tenderness on exam. Patient escorted to mammography for a screening mammogram.        Pelvic/Bimanual No Pap smear completed today since last Pap smear was October 2012. Pap smear not indicated per BCCCP guidelines.

## 2013-07-22 NOTE — Patient Instructions (Addendum)
Taught [name] how to perform BSE. Patient did not need a Pap smear today due to last Pap smear was in October 2012 per patient. Let her know BCCCP will cover Pap smears every 3 years unless has a history of abnormal Pap smears. Informed her that her next Pap smear is due next October 2015. Let patient know will follow up with her within the next couple weeks with results by letter or phone. [name] verbalized understanding. Patient escorted to mammography for a screening mammogram.  [name], Amber Maser, RN 1:13 PM

## 2014-04-11 ENCOUNTER — Encounter (HOSPITAL_COMMUNITY): Payer: Self-pay | Admitting: Emergency Medicine

## 2014-04-11 ENCOUNTER — Emergency Department (HOSPITAL_COMMUNITY)
Admission: EM | Admit: 2014-04-11 | Discharge: 2014-04-11 | Disposition: A | Discharge status: Home / Self Care | Payer: 59 | Source: Home / Self Care | Attending: Emergency Medicine | Admitting: Emergency Medicine

## 2014-04-11 ENCOUNTER — Emergency Department (INDEPENDENT_AMBULATORY_CARE_PROVIDER_SITE_OTHER): Payer: 59

## 2014-04-11 DIAGNOSIS — S52502A Unspecified fracture of the lower end of left radius, initial encounter for closed fracture: Secondary | ICD-10-CM

## 2014-04-11 DIAGNOSIS — S52599A Other fractures of lower end of unspecified radius, initial encounter for closed fracture: Secondary | ICD-10-CM

## 2014-04-11 IMAGING — CR DG WRIST COMPLETE 3+V*L*
4 series · 4 of 4 positions shown · non-contrast
Comparison: None.

CLINICAL DATA: Fall, radial wrist pain/deformity

EXAM:
LEFT WRIST - COMPLETE 3+ VIEW

[view not recorded (1 of 4)]
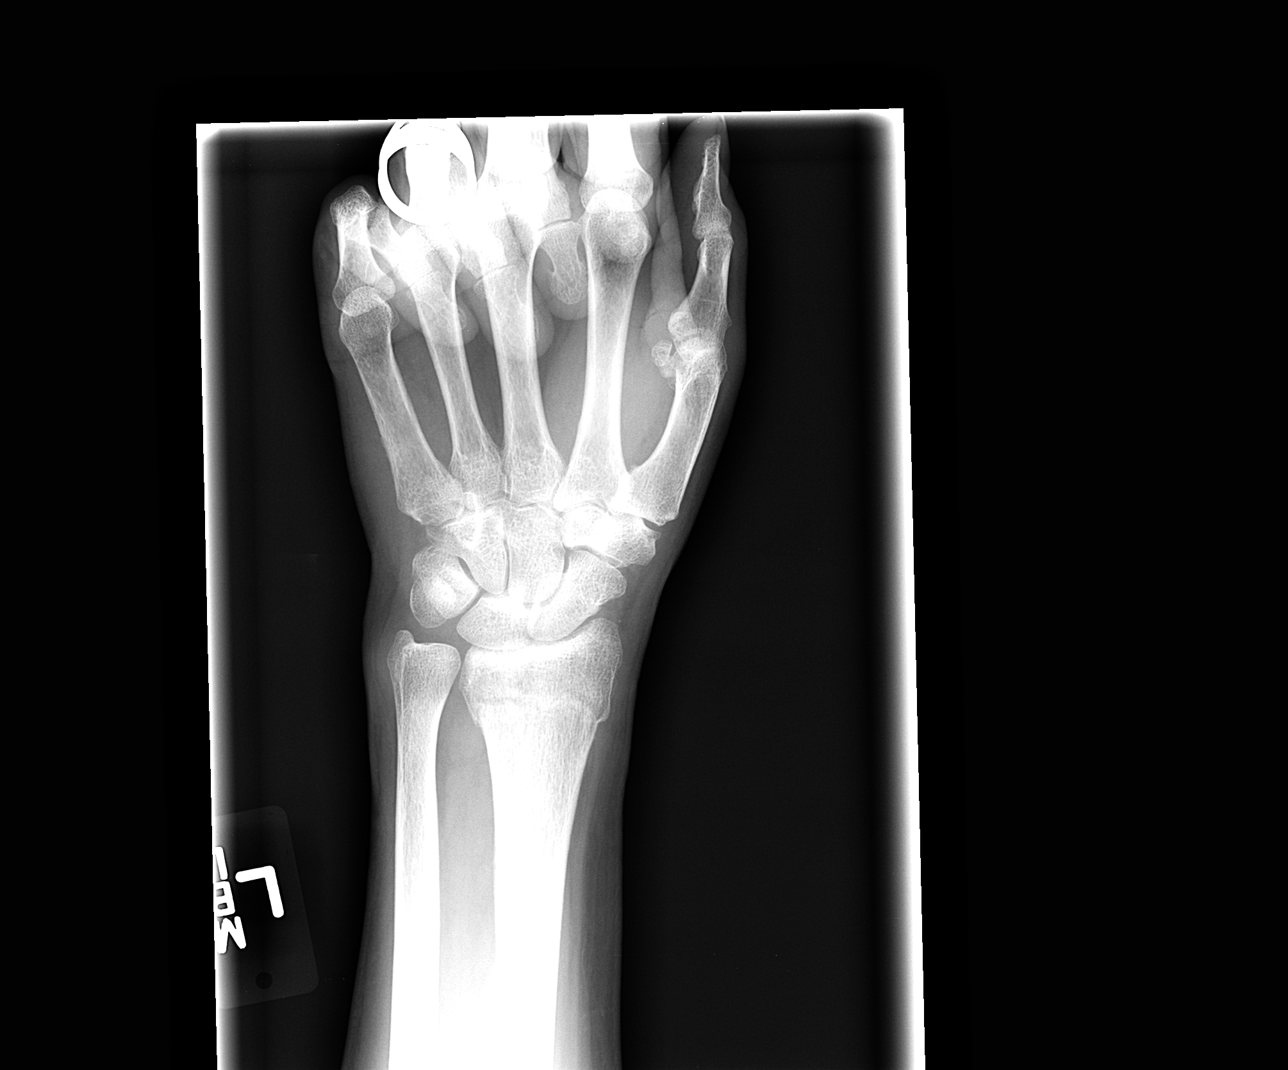

[view not recorded (2 of 4)]
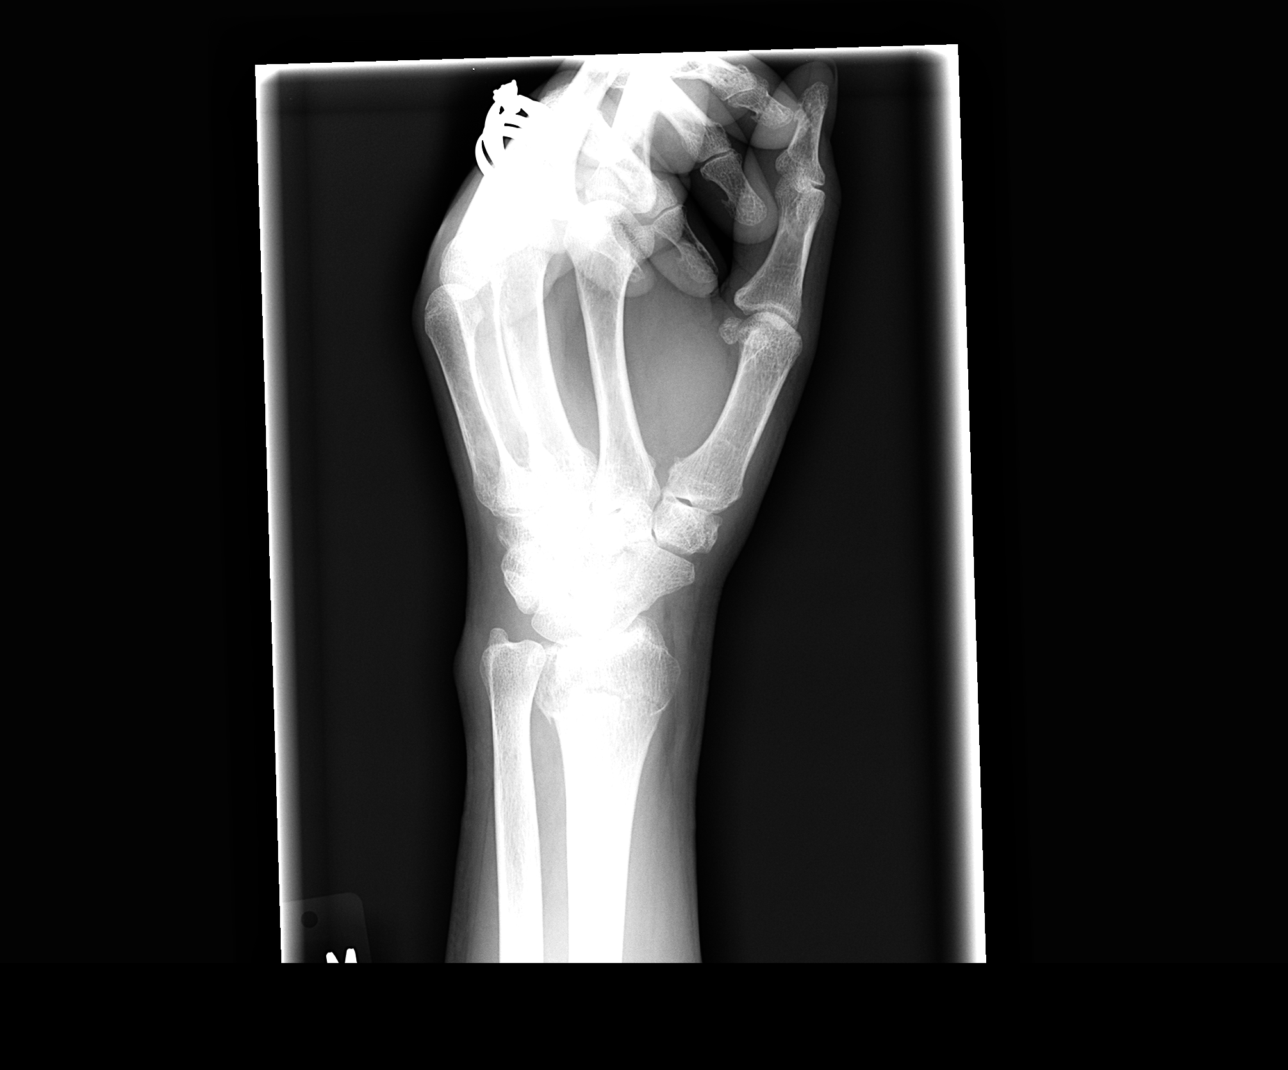

[view not recorded (3 of 4)]
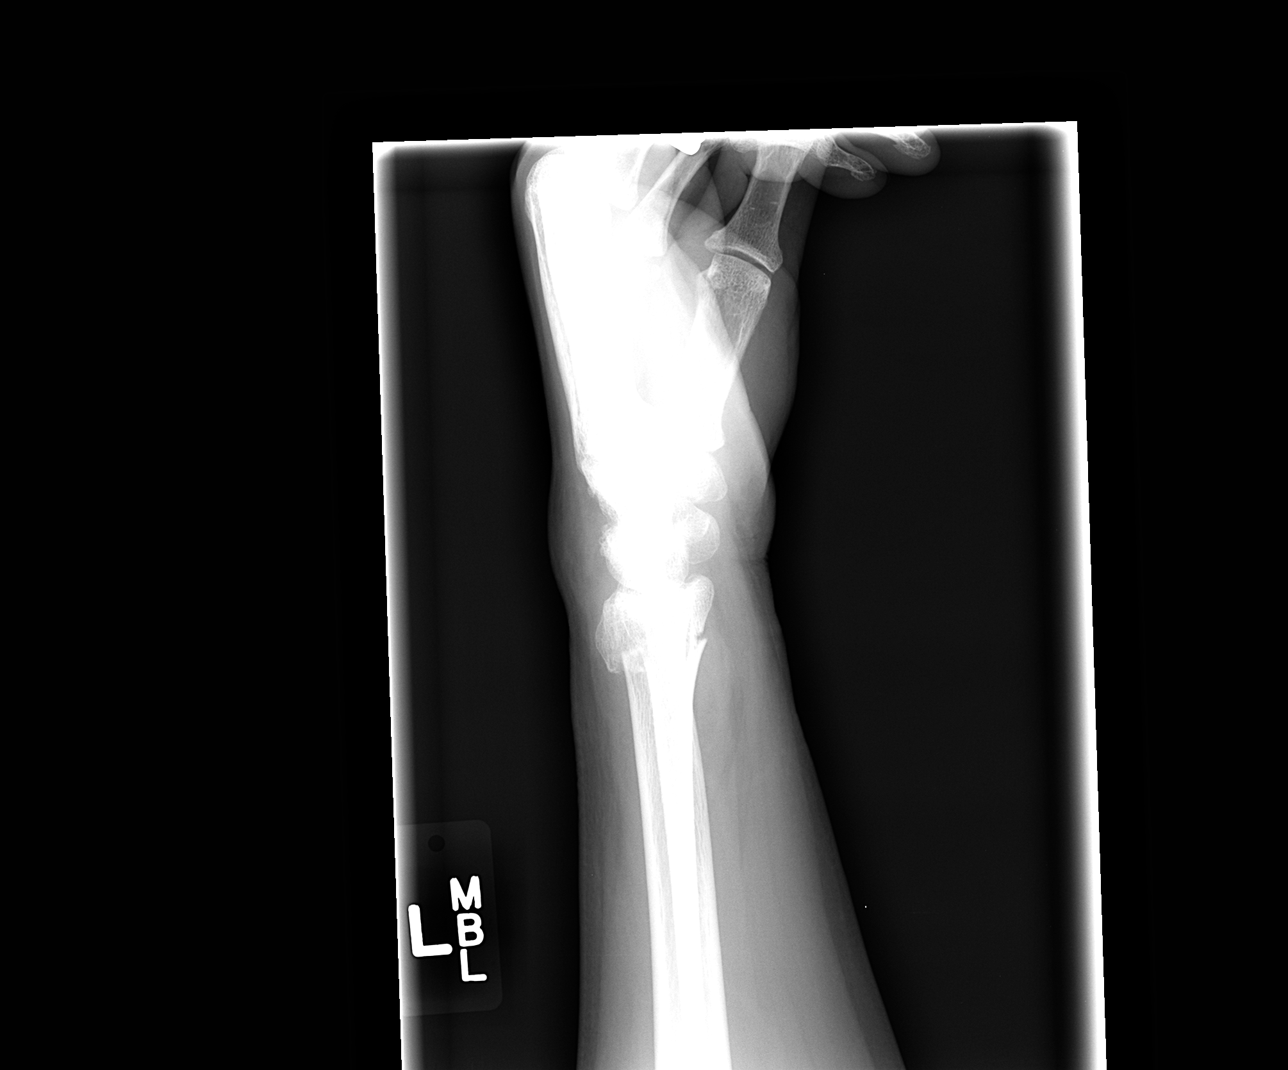

[view not recorded (4 of 4)]
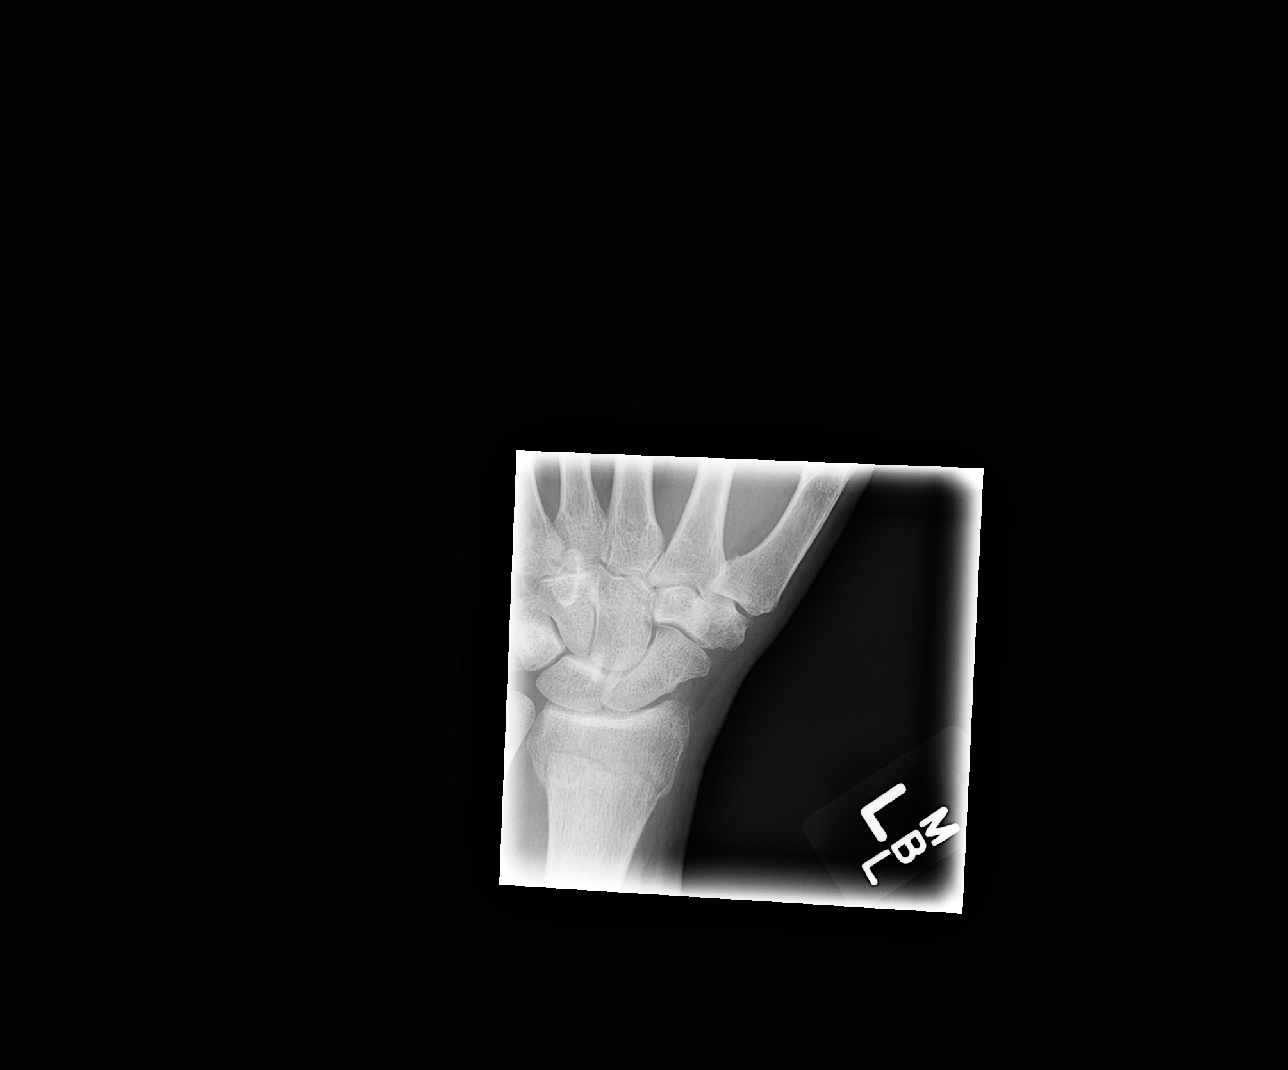

[4 of 4 positions shown; findings below may reference images not displayed]

FINDINGS: Transverse distal radial fracture. No definite intra-articular
extension.

No associated ulnar fracture.

Associated soft tissue swelling.
IMPRESSION: Transverse distal radial fracture, as above.

## 2014-04-11 MED ORDER — HYDROCODONE-ACETAMINOPHEN 5-325 MG PO TABS: 1.0000 | ORAL_TABLET | Freq: Four times a day (QID) | ORAL | Status: DC | PRN

## 2014-04-11 MED ORDER — IBUPROFEN 600 MG PO TABS
600.0000 mg | ORAL_TABLET | Freq: Three times a day (TID) | ORAL | Status: DC | PRN
Start: 2014-04-11 — End: 2014-08-31

## 2014-04-11 NOTE — Discharge Instructions (Signed)
You broke your wrist. Take ibuprofen 3 times a day to help with pain. You can take Norco as needed for severe pain. Apply ice to the wrist to help with pain and swelling.  Make an appointment with Dr. Ophelia CharterYates in orthopedic surgery to be seen in the next 1-2 weeks for follow up.

## 2014-04-11 NOTE — ED Notes (Signed)
Reports falling on left wrist while trying to break fall.  Limited ROM.  Swelling.  Incident happened around 5:15 p.m today.

## 2014-04-11 NOTE — Progress Notes (Signed)
Orthopedic Tech Progress Note Patient Details:  [name] 1956/07/13 [id number]  Ortho Devices Type of Ortho Device: Ace wrap;Sugartong splint Ortho Device/Splint Location: lue Ortho Device/Splint Interventions: Application   [name] 04/11/2014, 8:13 PM

## 2014-04-11 NOTE — ED Provider Notes (Signed)
CSN: [id number]     Arrival date & time 04/11/14  1844 History   First MD Initiated Contact with Patient 04/11/14 1909     Chief Complaint  Patient presents with  . Wrist Injury   (Consider location/radiation/quality/duration/timing/severity/associated sxs/prior Treatment) HPI She is here today with her husband for evaluation of left wrist injury. She was skating today, and fell landing on an outstretched left hand. She had immediate pain and swelling in the left wrist. She is able to wiggle her fingers. She can move her thumb, but it causes significant pain. No numbness or tingling in the fingers. No color change in the fingers.  Past Medical History  Diagnosis Date  . Hypertension   . Seizures    Past Surgical History  Procedure Laterality Date  . Cholecystectomy    . Tubal ligation    . Left foot surgery     Family History  Problem Relation Age of Onset  . Cancer Mother     lung  . Cancer Father     prostate  . Cancer Brother     lung and prostate  . Cancer Maternal Grandmother     lung   History  Substance Use Topics  . Smoking status: Never Smoker   . Smokeless tobacco: Never Used  . Alcohol Use: No   OB History   Grav Para Term Preterm Abortions TAB SAB Ect Mult Living   3         2     Review of Systems  Musculoskeletal: Positive for joint swelling.    Allergies  Dilantin and Librium  Home Medications   Prior to Admission medications   Medication Sig Start Date End Date Taking? Authorizing Provider  carbamazepine (TEGRETOL XR) 400 MG 12 hr tablet Take 400 mg by mouth 2 (two) times daily.   Yes Historical Provider, MD  triamterene-hydrochlorothiazide (MAXZIDE-25) 37.5-25 MG per tablet Take 1 tablet by mouth daily.   Yes Historical Provider, MD  Vitamin D, Ergocalciferol, (DRISDOL) 50000 UNITS CAPS capsule Take 50,000 Units by mouth every 7 (seven) days.   Yes Historical Provider, MD  HYDROcodone-acetaminophen (NORCO) 5-325 MG per tablet Take 1 tablet by  mouth every 6 (six) hours as needed for moderate pain. 04/11/14   Charm RingsErin J Lyvonne Cassell, MD  ibuprofen (ADVIL,MOTRIN) 600 MG tablet Take 1 tablet (600 mg total) by mouth every 8 (eight) hours as needed for moderate pain. 04/11/14   Charm RingsErin J Arisa Congleton, MD   BP 153/80  Pulse 86  Temp(Src) 99.4 F (37.4 C) (Oral)  Resp 20  SpO2 100% Physical Exam  Constitutional: She is oriented to person, place, and time. She appears well-developed and well-nourished. No distress.  Musculoskeletal:  Left wrist: swelling along radial aspect of wrist, minimal swelling on ulnar side; tender over distal radius Left hand: able to wiggle fingers; sensation grossly intact to light touch; brisk cap refill in fingertips; 2+ radial pulse  Neurological: She is alert and oriented to person, place, and time.    ED Course  Procedures (including critical care time) Labs Review Labs Reviewed - No data to display  Imaging Review Dg Wrist Complete Left  04/11/2014   CLINICAL DATA:  Fall, radial wrist pain/deformity  EXAM: LEFT WRIST - COMPLETE 3+ VIEW  COMPARISON:  None.  FINDINGS: Transverse distal radial fracture. No definite intra-articular extension.  No associated ulnar fracture.  Associated soft tissue swelling.  IMPRESSION: Transverse distal radial fracture, as above.   Electronically Signed   By: Charline BillsSriyesh  Krishnan  M.D.   On: 04/11/2014 19:29     MDM   1. Distal radial fracture, left, closed, initial encounter    Distal transverse radius fracture on x-ray. Will treat pain with ibuprofen and Norco. Ortho tech to apply sugar tong splint. Recommended applying ice. Followup with hand next week for further management.    Charm Rings, MD 04/11/14 [phone]

## 2014-06-26 ENCOUNTER — Ambulatory Visit (HOSPITAL_COMMUNITY)
Admission: RE | Admit: 2014-06-26 | Discharge: 2014-06-26 | Disposition: A | Discharge status: Home / Self Care | Payer: 59 | Source: Ambulatory Visit | Attending: Internal Medicine | Admitting: Internal Medicine

## 2014-06-26 ENCOUNTER — Other Ambulatory Visit (HOSPITAL_COMMUNITY): Payer: Self-pay | Admitting: Internal Medicine

## 2014-06-26 DIAGNOSIS — M5136 Other intervertebral disc degeneration, lumbar region: Secondary | ICD-10-CM | POA: Diagnosis not present

## 2014-06-26 DIAGNOSIS — S82892D Other fracture of left lower leg, subsequent encounter for closed fracture with routine healing: Secondary | ICD-10-CM | POA: Diagnosis present

## 2014-06-26 IMAGING — CR DG ANKLE COMPLETE 3+V*L*
3 series · 3 of 3 positions shown · non-contrast
Comparison: None.

CLINICAL DATA: History of fracture the left ankle with healing,
subsequent encounter

EXAM:
LEFT ANKLE COMPLETE - 3+ VIEW

[t ankle joint ap left]
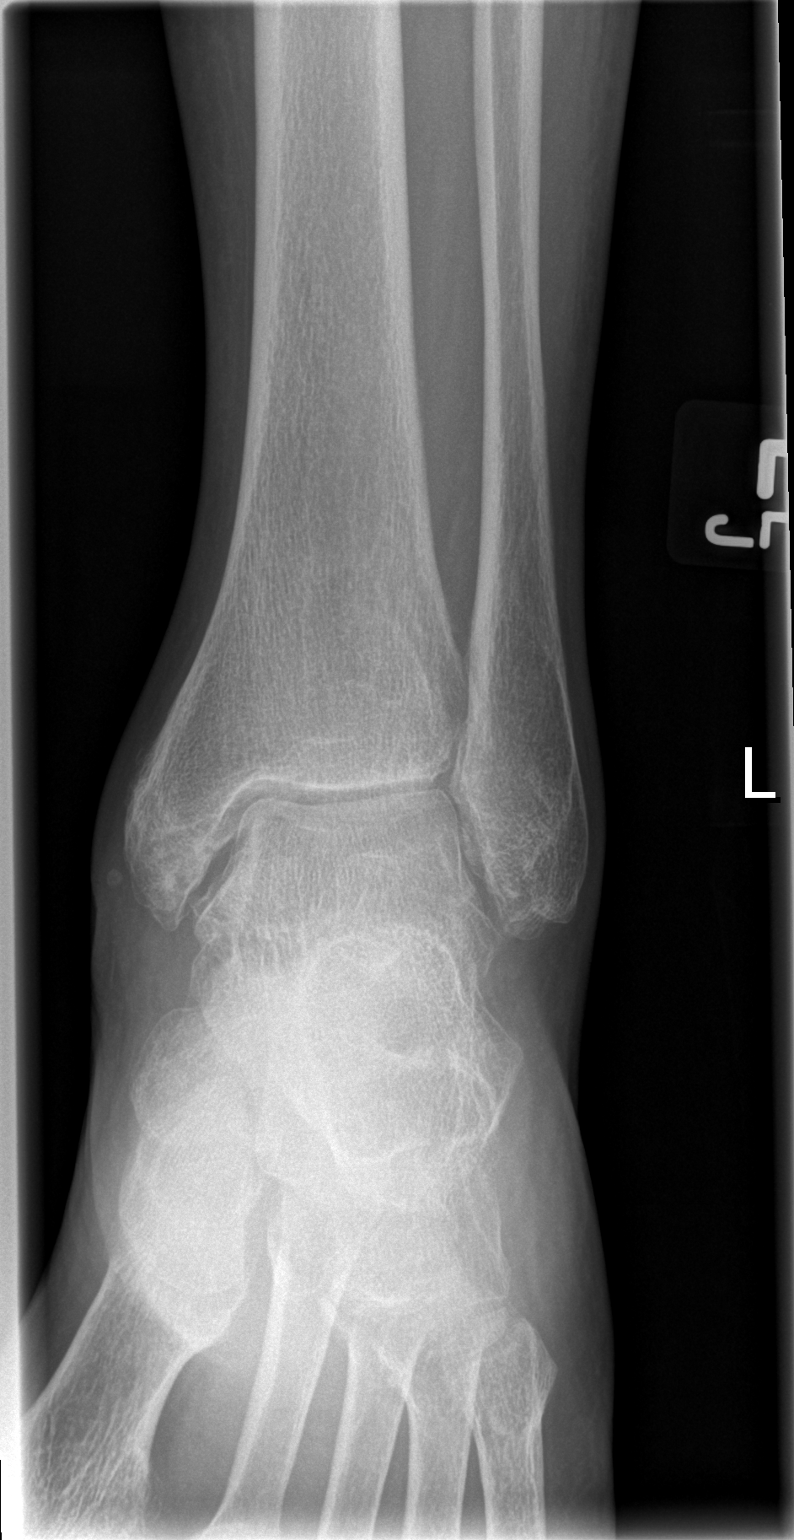

[t ankle joint oblique left]
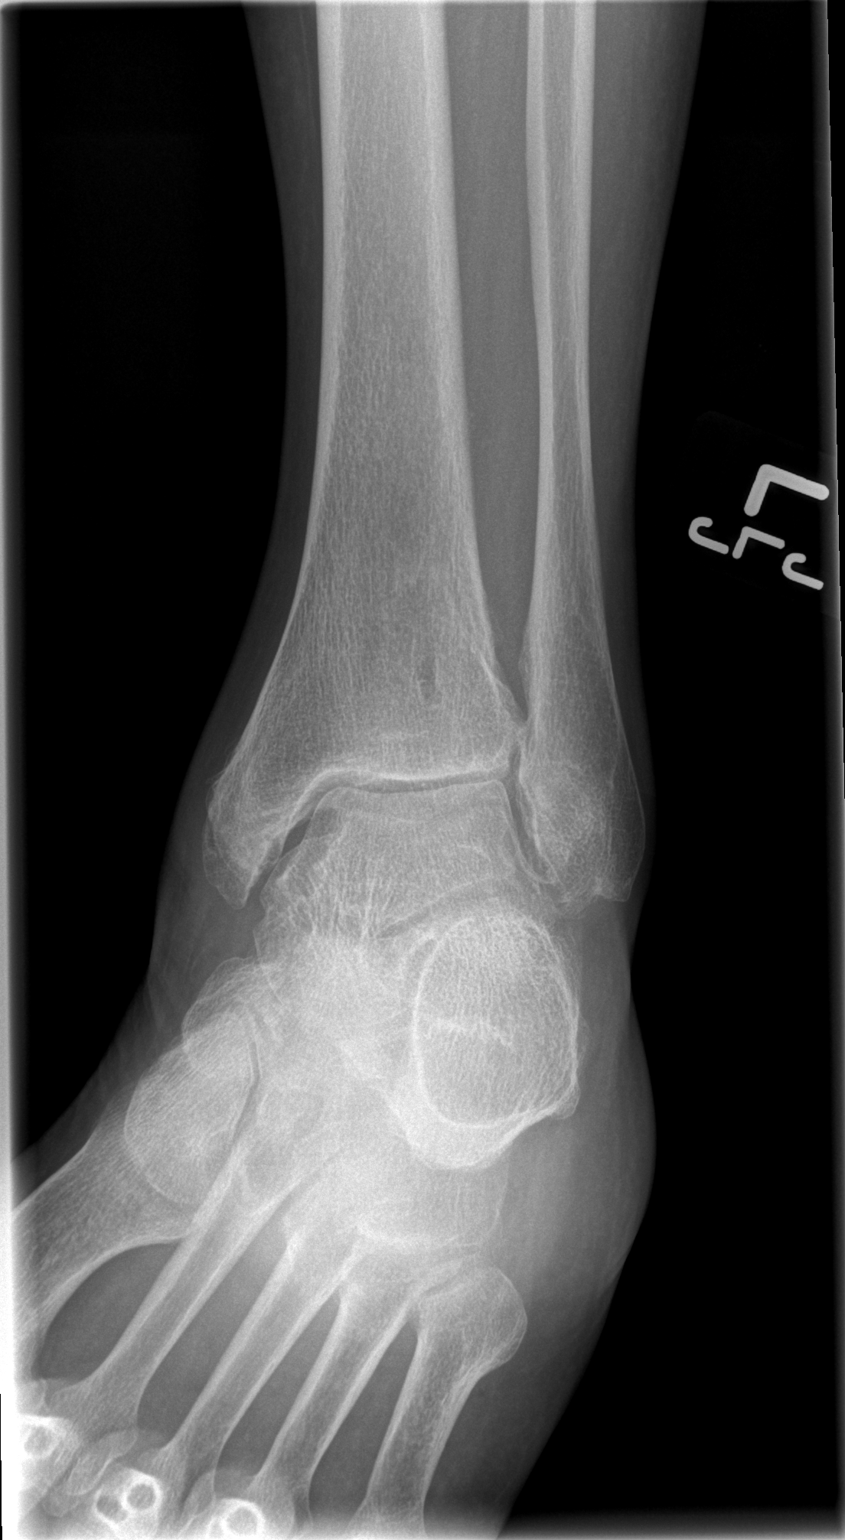

[t ankle joint lat left]
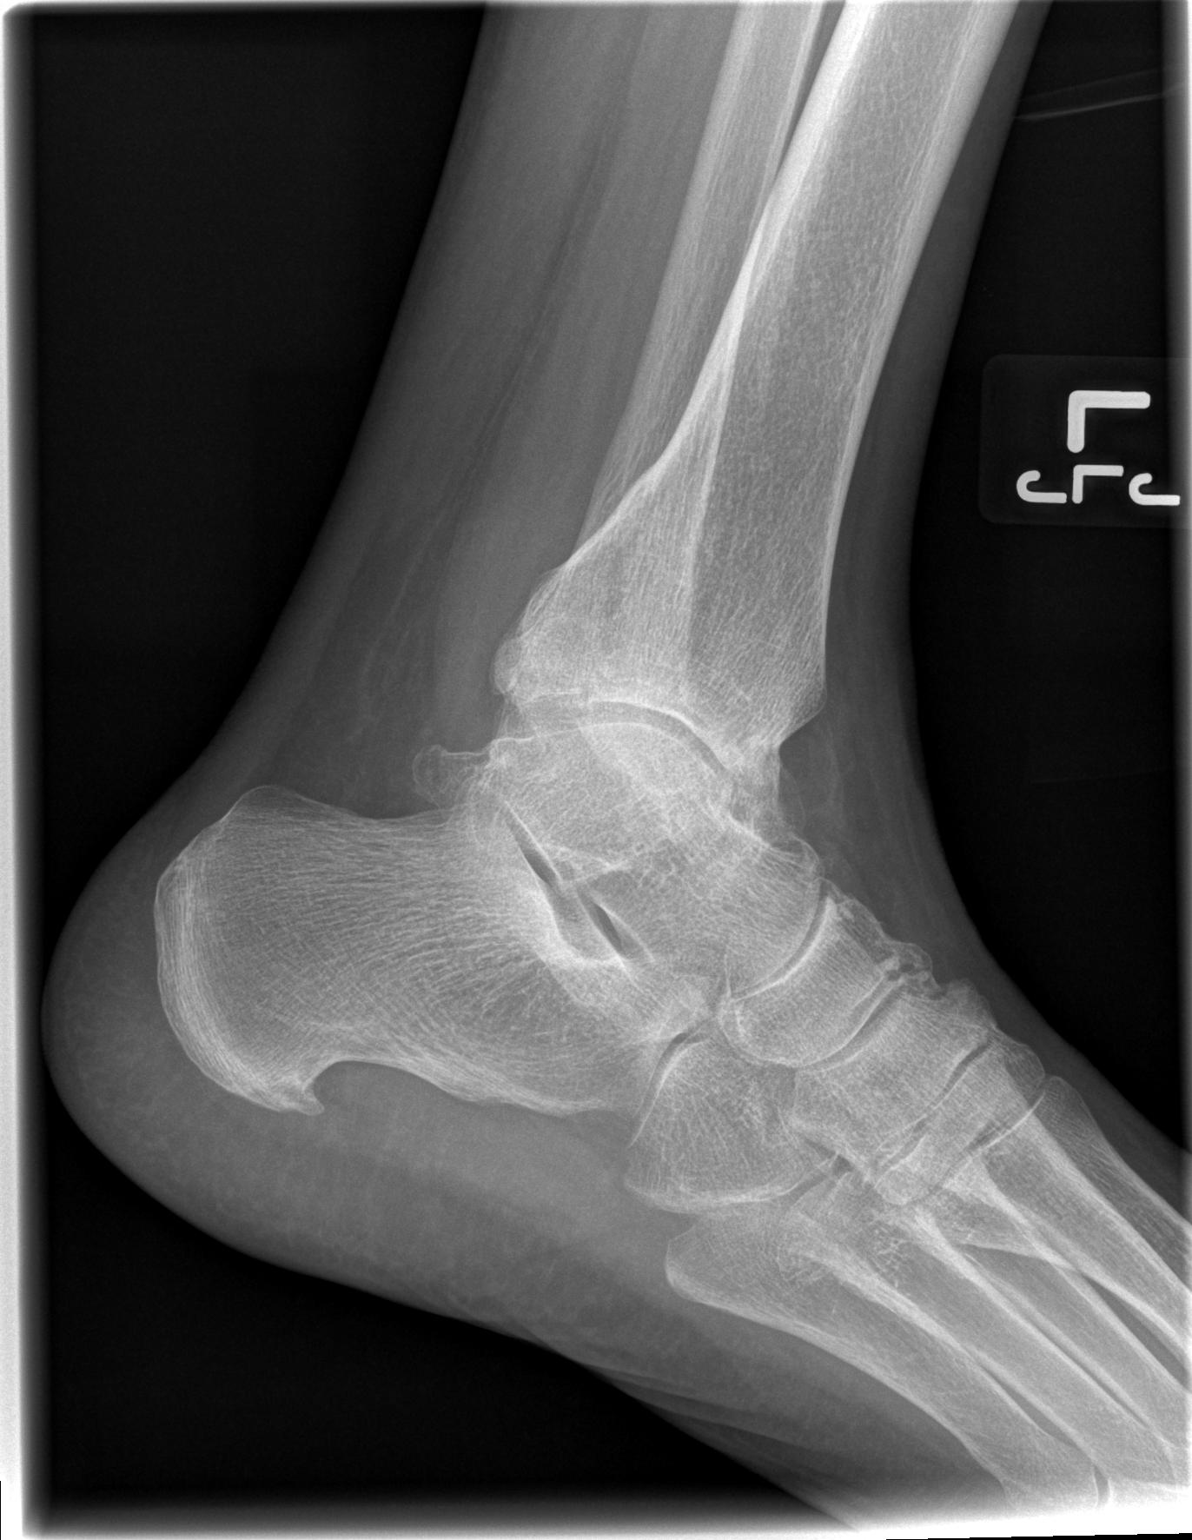

[3 of 3 positions shown; findings below may reference images not displayed]

FINDINGS: Some irregularity of the medial malleolus most likely represents a
healed or healing medial malleolar fracture. The ankle joint is
unremarkable other than some loss of joint space, sclerosis, and
spurring consistent with degenerative change. A small plantar
calcaneal degenerative degenerative spur is noted and there are
degenerative changes in the midfoot.
IMPRESSION: Diffuse degenerative change throughout the ankle and midfoot.
Probable healing fracture of the medial malleolus. Correlate with
any prior images.

## 2014-06-26 IMAGING — CR DG LUMBAR SPINE COMPLETE 4+V
5 series · 5 of 5 positions shown · non-contrast
Comparison: None.

CLINICAL DATA: Patient fell 2 months previously with persistent low
back pain with lower extremity radicular symptoms bilaterally

EXAM:
LUMBAR SPINE - COMPLETE 4+ VIEW

[t l-spine a.p.]
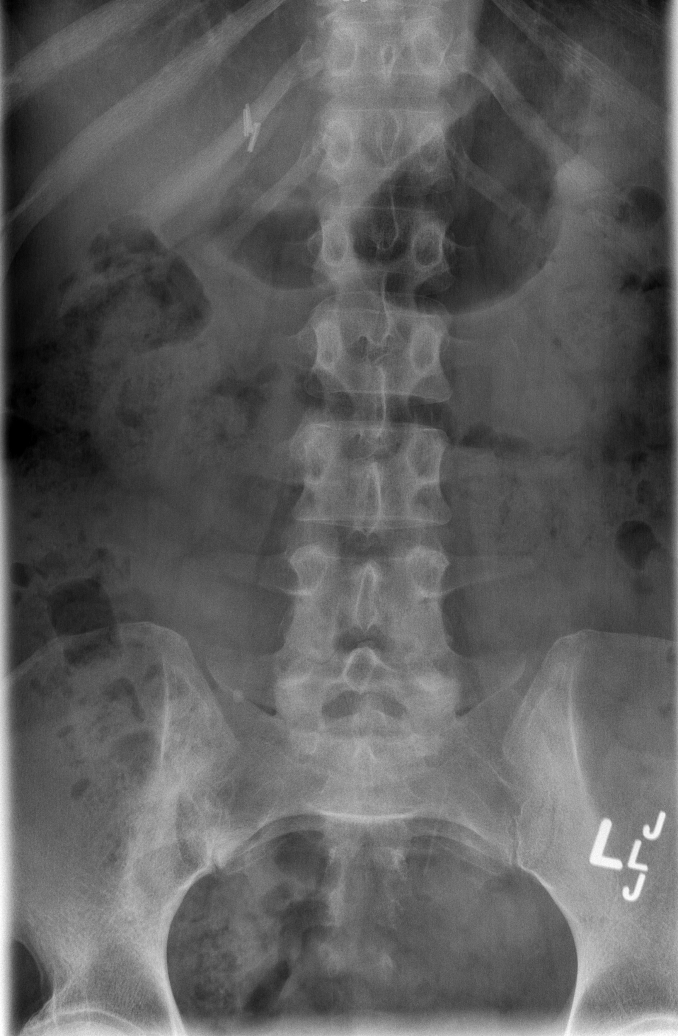

[t l-spine oblique exposure (1 of 2)]
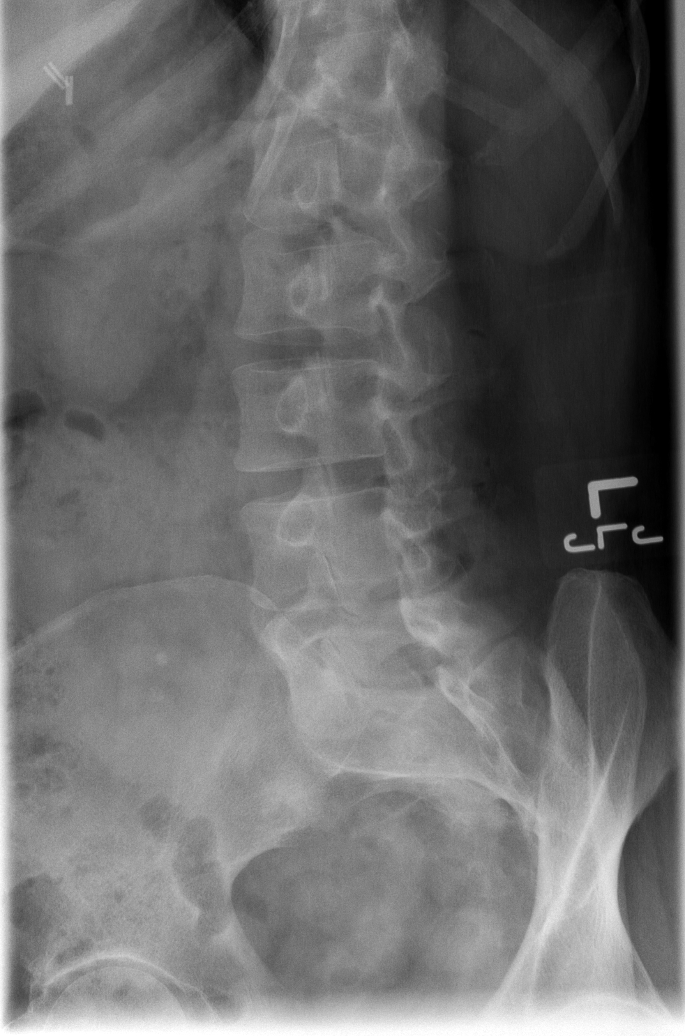

[t l-spine oblique exposure (2 of 2)]
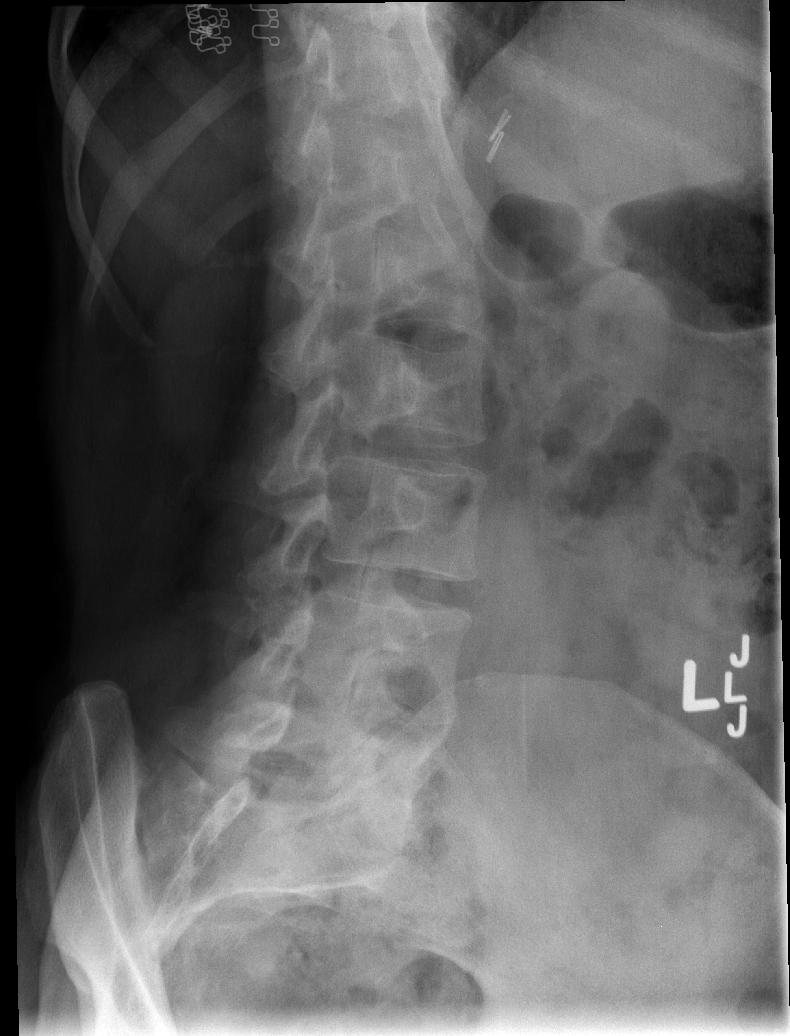

[t l-spine lat]
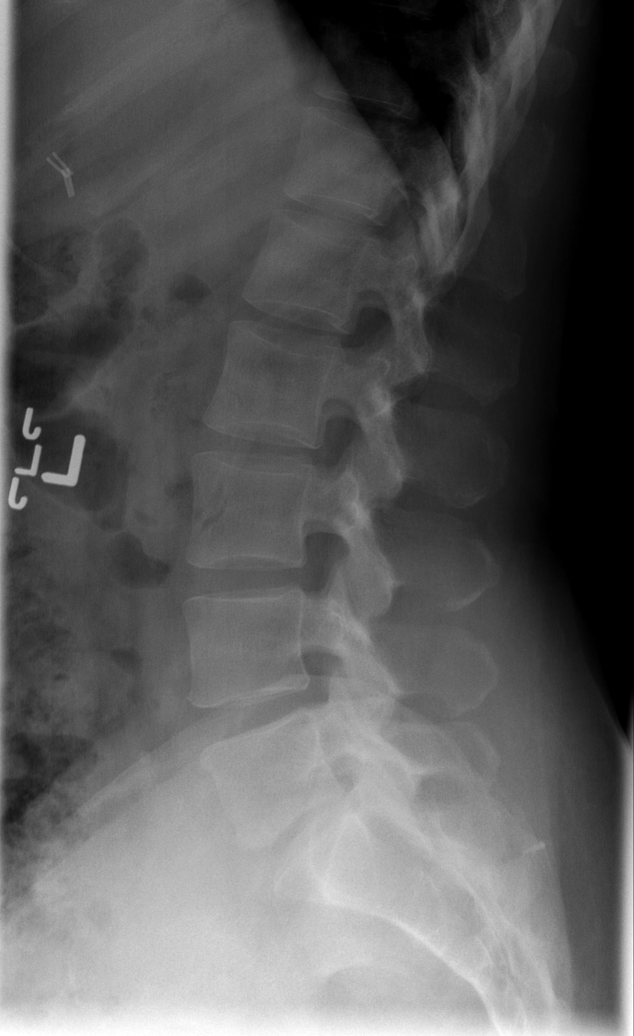

[t l-spine l5-s1 spot]
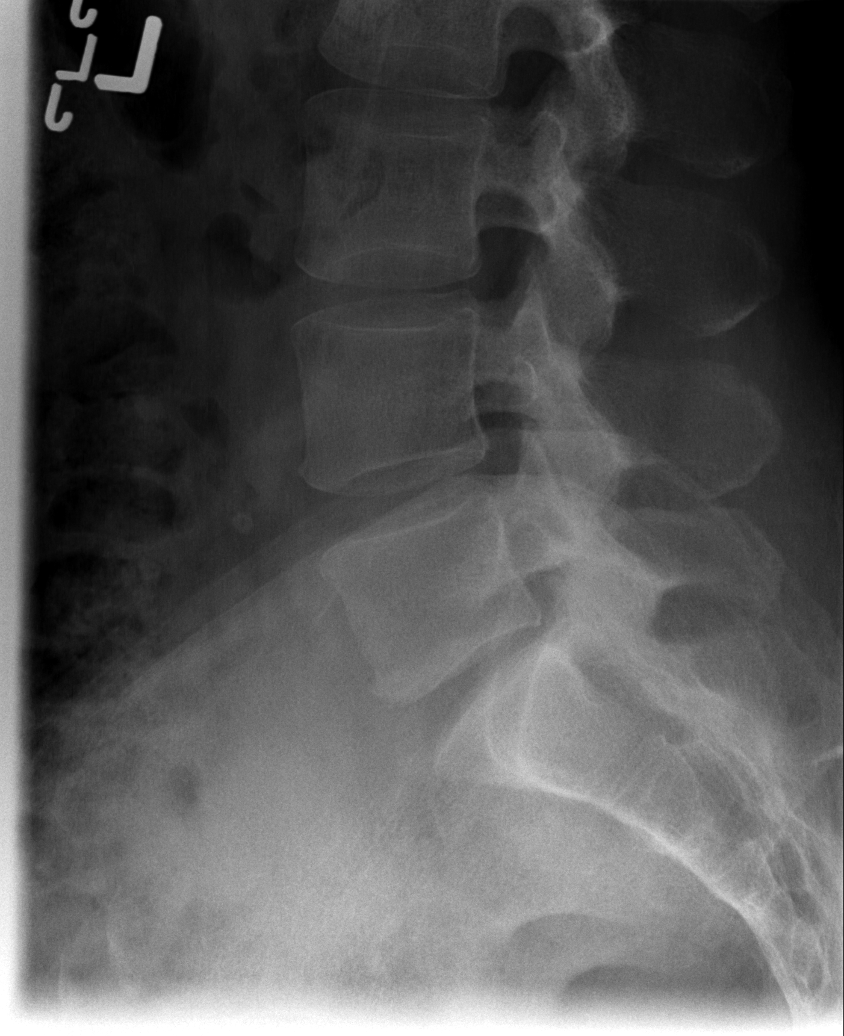

[5 of 5 positions shown; findings below may reference images not displayed]

FINDINGS: Frontal, lateral, spot lumbosacral lateral, and bilateral oblique
views were obtained. There are 5 non-rib-bearing lumbar type
vertebral bodies. There is no fracture or spondylolisthesis. Disc
spaces are intact. There is facet osteoarthritic change at L4-5 and
L5-S1 bilaterally.
IMPRESSION: Facet osteoarthritic change at L4-5 and L5-S1 bilaterally. No
fracture or spondylolisthesis.

## 2014-07-09 ENCOUNTER — Other Ambulatory Visit (HOSPITAL_COMMUNITY): Payer: Self-pay | Admitting: Obstetrics

## 2014-07-09 DIAGNOSIS — Z1231 Encounter for screening mammogram for malignant neoplasm of breast: Secondary | ICD-10-CM

## 2014-07-13 ENCOUNTER — Encounter (HOSPITAL_COMMUNITY): Payer: Self-pay | Admitting: Emergency Medicine

## 2014-07-24 ENCOUNTER — Ambulatory Visit (HOSPITAL_COMMUNITY)
Admission: RE | Admit: 2014-07-24 | Discharge: 2014-07-24 | Disposition: A | Discharge status: Home / Self Care | Payer: 59 | Source: Ambulatory Visit | Attending: Obstetrics | Admitting: Obstetrics

## 2014-07-24 DIAGNOSIS — Z1231 Encounter for screening mammogram for malignant neoplasm of breast: Secondary | ICD-10-CM | POA: Diagnosis present

## 2014-07-29 LAB — CYTOLOGY - PAP: Pap: NEGATIVE

## 2014-08-31 ENCOUNTER — Encounter (HOSPITAL_COMMUNITY): Payer: Self-pay | Admitting: Family Medicine

## 2014-08-31 ENCOUNTER — Emergency Department (HOSPITAL_COMMUNITY)
Admission: EM | Admit: 2014-08-31 | Discharge: 2014-08-31 | Disposition: A | Discharge status: Home / Self Care | Payer: 59 | Attending: Emergency Medicine | Admitting: Emergency Medicine

## 2014-08-31 DIAGNOSIS — I10 Essential (primary) hypertension: Secondary | ICD-10-CM | POA: Diagnosis not present

## 2014-08-31 DIAGNOSIS — Y92410 Unspecified street and highway as the place of occurrence of the external cause: Secondary | ICD-10-CM | POA: Diagnosis not present

## 2014-08-31 DIAGNOSIS — Z791 Long term (current) use of non-steroidal anti-inflammatories (NSAID): Secondary | ICD-10-CM | POA: Insufficient documentation

## 2014-08-31 DIAGNOSIS — Z79899 Other long term (current) drug therapy: Secondary | ICD-10-CM | POA: Diagnosis not present

## 2014-08-31 DIAGNOSIS — S0990XA Unspecified injury of head, initial encounter: Secondary | ICD-10-CM | POA: Diagnosis not present

## 2014-08-31 DIAGNOSIS — Y9389 Activity, other specified: Secondary | ICD-10-CM | POA: Insufficient documentation

## 2014-08-31 DIAGNOSIS — S4991XA Unspecified injury of right shoulder and upper arm, initial encounter: Secondary | ICD-10-CM | POA: Insufficient documentation

## 2014-08-31 DIAGNOSIS — Y998 Other external cause status: Secondary | ICD-10-CM | POA: Insufficient documentation

## 2014-08-31 DIAGNOSIS — S199XXA Unspecified injury of neck, initial encounter: Secondary | ICD-10-CM | POA: Insufficient documentation

## 2014-08-31 DIAGNOSIS — S29002A Unspecified injury of muscle and tendon of back wall of thorax, initial encounter: Secondary | ICD-10-CM | POA: Diagnosis not present

## 2014-08-31 DIAGNOSIS — G40909 Epilepsy, unspecified, not intractable, without status epilepticus: Secondary | ICD-10-CM | POA: Insufficient documentation

## 2014-08-31 DIAGNOSIS — M549 Dorsalgia, unspecified: Secondary | ICD-10-CM

## 2014-08-31 MED ORDER — CYCLOBENZAPRINE HCL 10 MG PO TABS: 10.0000 mg | ORAL_TABLET | Freq: Three times a day (TID) | ORAL | Status: DC | PRN

## 2014-08-31 MED ORDER — IBUPROFEN 800 MG PO TABS: 800.0000 mg | ORAL_TABLET | Freq: Three times a day (TID) | ORAL | Status: DC | PRN

## 2014-08-31 NOTE — ED Notes (Signed)
Declined W/C at D/C and was escorted to lobby by RN. 
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
## 2014-08-31 NOTE — Discharge Instructions (Signed)
Read the information below.  Use the prescribed medication as directed.  Please discuss all new medications with your pharmacist.  You may return to the Emergency Department at any time for worsening condition or any new symptoms that concern you.    If you develop fevers, loss of control of bowel or bladder, weakness or numbness in your legs, or are unable to walk, return to the ER for a recheck.  °

## 2015-05-12 ENCOUNTER — Other Ambulatory Visit: Payer: Self-pay

## 2015-05-12 DIAGNOSIS — Z1231 Encounter for screening mammogram for malignant neoplasm of breast: Secondary | ICD-10-CM

## 2015-07-28 ENCOUNTER — Ambulatory Visit
Admission: RE | Admit: 2015-07-28 | Discharge: 2015-07-28 | Disposition: A | Discharge status: Home / Self Care | Payer: 59 | Source: Ambulatory Visit

## 2015-07-28 DIAGNOSIS — Z1231 Encounter for screening mammogram for malignant neoplasm of breast: Secondary | ICD-10-CM

## 2016-01-11 DIAGNOSIS — Z131 Encounter for screening for diabetes mellitus: Secondary | ICD-10-CM | POA: Diagnosis not present

## 2016-01-11 DIAGNOSIS — E569 Vitamin deficiency, unspecified: Secondary | ICD-10-CM | POA: Diagnosis not present

## 2016-01-11 DIAGNOSIS — H6092 Unspecified otitis externa, left ear: Secondary | ICD-10-CM | POA: Diagnosis not present

## 2016-01-11 DIAGNOSIS — I10 Essential (primary) hypertension: Secondary | ICD-10-CM | POA: Diagnosis not present

## 2016-01-11 DIAGNOSIS — E784 Other hyperlipidemia: Secondary | ICD-10-CM | POA: Diagnosis not present

## 2016-01-13 ENCOUNTER — Other Ambulatory Visit: Payer: Self-pay | Admitting: Internal Medicine

## 2016-01-13 DIAGNOSIS — E2839 Other primary ovarian failure: Secondary | ICD-10-CM

## 2016-02-01 ENCOUNTER — Ambulatory Visit
Admission: RE | Admit: 2016-02-01 | Discharge: 2016-02-01 | Disposition: A | Discharge status: Home / Self Care | Payer: BLUE CROSS/BLUE SHIELD | Source: Ambulatory Visit | Attending: Internal Medicine | Admitting: Internal Medicine

## 2016-02-01 DIAGNOSIS — M85851 Other specified disorders of bone density and structure, right thigh: Secondary | ICD-10-CM | POA: Diagnosis not present

## 2016-02-01 DIAGNOSIS — E2839 Other primary ovarian failure: Secondary | ICD-10-CM

## 2016-02-01 DIAGNOSIS — Z780 Asymptomatic menopausal state: Secondary | ICD-10-CM | POA: Diagnosis not present

## 2016-03-17 DIAGNOSIS — D638 Anemia in other chronic diseases classified elsewhere: Secondary | ICD-10-CM | POA: Diagnosis not present

## 2016-03-17 DIAGNOSIS — I10 Essential (primary) hypertension: Secondary | ICD-10-CM | POA: Diagnosis not present

## 2016-04-27 DIAGNOSIS — H2513 Age-related nuclear cataract, bilateral: Secondary | ICD-10-CM | POA: Diagnosis not present

## 2016-04-27 DIAGNOSIS — H25043 Posterior subcapsular polar age-related cataract, bilateral: Secondary | ICD-10-CM | POA: Diagnosis not present

## 2016-04-27 DIAGNOSIS — H539 Unspecified visual disturbance: Secondary | ICD-10-CM | POA: Diagnosis not present

## 2016-04-27 DIAGNOSIS — H25013 Cortical age-related cataract, bilateral: Secondary | ICD-10-CM | POA: Diagnosis not present

## 2016-04-27 DIAGNOSIS — H35373 Puckering of macula, bilateral: Secondary | ICD-10-CM | POA: Diagnosis not present

## 2016-04-27 DIAGNOSIS — I10 Essential (primary) hypertension: Secondary | ICD-10-CM | POA: Diagnosis not present

## 2016-04-27 DIAGNOSIS — E559 Vitamin D deficiency, unspecified: Secondary | ICD-10-CM | POA: Diagnosis not present

## 2016-04-27 DIAGNOSIS — J069 Acute upper respiratory infection, unspecified: Secondary | ICD-10-CM | POA: Diagnosis not present

## 2016-05-29 ENCOUNTER — Encounter: Payer: Self-pay | Admitting: *Deleted

## 2016-06-22 ENCOUNTER — Encounter: Payer: Self-pay | Admitting: Obstetrics

## 2016-06-22 ENCOUNTER — Ambulatory Visit (INDEPENDENT_AMBULATORY_CARE_PROVIDER_SITE_OTHER): Payer: BLUE CROSS/BLUE SHIELD | Admitting: Obstetrics

## 2016-06-22 VITALS — BP 154/85 | HR 71 | Temp 97.9°F | Ht 66.0 in | Wt 177.0 lb

## 2016-06-22 DIAGNOSIS — Z780 Asymptomatic menopausal state: Secondary | ICD-10-CM

## 2016-06-22 DIAGNOSIS — Z124 Encounter for screening for malignant neoplasm of cervix: Secondary | ICD-10-CM

## 2016-06-22 DIAGNOSIS — Z01419 Encounter for gynecological examination (general) (routine) without abnormal findings: Secondary | ICD-10-CM

## 2016-06-22 DIAGNOSIS — Z1151 Encounter for screening for human papillomavirus (HPV): Secondary | ICD-10-CM

## 2016-06-22 NOTE — Progress Notes (Signed)
Subjective:        [name] is a 60 y.o. female here for a routine exam.  Current complaints: None.    Personal health questionnaire:  Is patient Ashkenazi Jewish, have a family history of breast and/or ovarian cancer: no Is there a family history of uterine cancer diagnosed at age < 63, gastrointestinal cancer, urinary tract cancer, family member who is a Personnel officer syndrome-associated carrier: no Is the patient overweight and hypertensive, family history of diabetes, personal history of gestational diabetes, preeclampsia or PCOS: no Is patient over 75, have PCOS,  family history of premature CHD under age 31, diabetes, smoke, have hypertension or peripheral artery disease:  no At any time, has a partner hit, kicked or otherwise hurt or frightened you?: no Over the past 2 weeks, have you felt down, depressed or hopeless?: no Over the past 2 weeks, have you felt little interest or pleasure in doing things?:no   Gynecologic History No LMP recorded. Patient is postmenopausal. Contraception: post menopausal status Last Pap: 2015. Results were: normal Last mammogram: 2017. Results were: normal  Obstetric History OB History  Gravida Para Term Preterm AB Living  3         2  SAB TAB Ectopic Multiple Live Births               # Outcome Date GA Lbr Len/2nd Weight Sex Delivery Anes PTL Lv  3 Gravida           2 Gravida           1 [place]               Past Medical History:  Diagnosis Date  . Connective tissue disorder (HCC)   . Hypertension   . Seizures (HCC)     Past Surgical History:  Procedure Laterality Date  . CHOLECYSTECTOMY    . HERNIA REPAIR    . left foot surgery    . TUBAL LIGATION       Current Outpatient Prescriptions:  .  cyclobenzaprine (FLEXERIL) 10 MG tablet, Take 1 tablet (10 mg total) by mouth 3 (three) times daily as needed for muscle spasms., Disp: 15 tablet, Rfl: 0 .  triamterene-hydrochlorothiazide (MAXZIDE-25) 37.5-25 MG per tablet, Take 1  tablet by mouth daily., Disp: , Rfl:  .  Vitamin D, Ergocalciferol, (DRISDOL) 50000 UNITS CAPS capsule, Take 50,000 Units by mouth every 7 (seven) days., Disp: , Rfl:  .  carbamazepine (TEGRETOL XR) 400 MG 12 hr tablet, Take 400 mg by mouth 2 (two) times daily., Disp: , Rfl:  Allergies  Allergen Reactions  . Dilantin [Phenytoin Sodium Extended]   . Librium [Chlordiazepoxide Hcl]   . Tylenol [Acetaminophen] Other (See Comments)    Sensitivity- may take 1-2 doses- but has chest pain after that    Social History  Substance Use Topics  . Smoking status: Never Smoker  . Smokeless tobacco: Never Used  . Alcohol use No    Family History  Problem Relation Age of Onset  . Cancer Mother     lung  . Hypertension Mother   . Cancer Father     prostate  . Cancer Brother     lung and prostate  . Cancer Maternal Grandmother     lung      Review of Systems  Constitutional: negative for fatigue and weight loss Respiratory: negative for cough and wheezing Cardiovascular: negative for chest pain, fatigue and palpitations Gastrointestinal: negative for abdominal pain and change in bowel  habits Musculoskeletal:negative for myalgias Neurological: negative for gait problems and tremors Behavioral/Psych: negative for abusive relationship, depression Endocrine: negative for temperature intolerance   Genitourinary:negative for abnormal menstrual periods, genital lesions, hot flashes, sexual problems and vaginal discharge Integument/breast: negative for breast lump, breast tenderness, nipple discharge and skin lesion(s)    Objective:       BP (!) 154/85   Pulse 71   Temp 97.9 F (36.6 C)   Ht 5\' 6"  (1.676 m)   Wt 177 lb (80.3 kg)   BMI 28.57 kg/m  General:   alert  Skin:   no rash or abnormalities  Lungs:   clear to auscultation bilaterally  Heart:   regular rate and rhythm, S1, S2 normal, no murmur, click, rub or gallop  Breasts:   normal without suspicious masses, skin or nipple  changes or axillary nodes  Abdomen:  normal findings: no organomegaly, soft, non-tender and no hernia  Pelvis:  External genitalia: normal general appearance Urinary system: urethral meatus normal and bladder without fullness, nontender Vaginal: normal without tenderness, induration or masses Cervix: normal appearance Adnexa: normal bimanual exam Uterus: anteverted and non-tender, normal size   Lab Review Urine pregnancy test Labs reviewed yes Radiologic studies reviewed yes  50% of 20 min visit spent on counseling and coordination of care.   Assessment:    Healthy female exam.    Postmenopause.  Doing well   Plan:    Education reviewed: calcium supplements, low fat, low cholesterol diet, self breast exams and weight bearing exercise. Follow up in: 1 year.   No orders of the defined types were placed in this encounter.  No orders of the defined types were placed in this encounter.     Patient ID: [name], female   DOB: 1955-12-19, 60 y.o.   MRN: [id number]

## 2016-06-26 LAB — CYTOLOGY - PAP
Diagnosis: NEGATIVE
HPV: NOT DETECTED

## 2016-06-26 LAB — CERVICOVAGINAL ANCILLARY ONLY
BACTERIAL VAGINITIS: NEGATIVE
CANDIDA VAGINITIS: NEGATIVE

## 2016-07-25 DIAGNOSIS — H25043 Posterior subcapsular polar age-related cataract, bilateral: Secondary | ICD-10-CM | POA: Diagnosis not present

## 2016-07-25 DIAGNOSIS — H3561 Retinal hemorrhage, right eye: Secondary | ICD-10-CM | POA: Diagnosis not present

## 2016-07-25 DIAGNOSIS — H25013 Cortical age-related cataract, bilateral: Secondary | ICD-10-CM | POA: Diagnosis not present

## 2016-07-25 DIAGNOSIS — H2513 Age-related nuclear cataract, bilateral: Secondary | ICD-10-CM | POA: Diagnosis not present

## 2016-09-05 DIAGNOSIS — H02839 Dermatochalasis of unspecified eye, unspecified eyelid: Secondary | ICD-10-CM | POA: Diagnosis not present

## 2016-09-05 DIAGNOSIS — H25011 Cortical age-related cataract, right eye: Secondary | ICD-10-CM | POA: Diagnosis not present

## 2016-09-05 DIAGNOSIS — H18411 Arcus senilis, right eye: Secondary | ICD-10-CM | POA: Diagnosis not present

## 2016-09-05 DIAGNOSIS — H2511 Age-related nuclear cataract, right eye: Secondary | ICD-10-CM | POA: Diagnosis not present

## 2016-10-06 ENCOUNTER — Other Ambulatory Visit: Payer: Self-pay | Admitting: Family

## 2016-10-06 DIAGNOSIS — Z1231 Encounter for screening mammogram for malignant neoplasm of breast: Secondary | ICD-10-CM

## 2016-10-16 ENCOUNTER — Ambulatory Visit
Admission: RE | Admit: 2016-10-16 | Discharge: 2016-10-16 | Disposition: A | Discharge status: Home / Self Care | Payer: BLUE CROSS/BLUE SHIELD | Source: Ambulatory Visit | Attending: Family | Admitting: Family

## 2016-10-16 DIAGNOSIS — Z1231 Encounter for screening mammogram for malignant neoplasm of breast: Secondary | ICD-10-CM | POA: Diagnosis not present

## 2016-10-19 DIAGNOSIS — E559 Vitamin D deficiency, unspecified: Secondary | ICD-10-CM | POA: Diagnosis not present

## 2016-10-19 DIAGNOSIS — Z8639 Personal history of other endocrine, nutritional and metabolic disease: Secondary | ICD-10-CM | POA: Diagnosis not present

## 2016-10-19 DIAGNOSIS — I10 Essential (primary) hypertension: Secondary | ICD-10-CM | POA: Diagnosis not present

## 2016-10-19 DIAGNOSIS — D509 Iron deficiency anemia, unspecified: Secondary | ICD-10-CM | POA: Diagnosis not present

## 2016-11-06 DIAGNOSIS — H25041 Posterior subcapsular polar age-related cataract, right eye: Secondary | ICD-10-CM | POA: Diagnosis not present

## 2016-11-06 DIAGNOSIS — H2511 Age-related nuclear cataract, right eye: Secondary | ICD-10-CM | POA: Diagnosis not present

## 2016-11-07 DIAGNOSIS — H2512 Age-related nuclear cataract, left eye: Secondary | ICD-10-CM | POA: Diagnosis not present

## 2016-11-27 DIAGNOSIS — H2512 Age-related nuclear cataract, left eye: Secondary | ICD-10-CM | POA: Diagnosis not present

## 2016-11-28 DIAGNOSIS — H25042 Posterior subcapsular polar age-related cataract, left eye: Secondary | ICD-10-CM | POA: Diagnosis not present

## 2016-11-28 DIAGNOSIS — H25041 Posterior subcapsular polar age-related cataract, right eye: Secondary | ICD-10-CM | POA: Diagnosis not present

## 2017-02-16 DIAGNOSIS — R569 Unspecified convulsions: Secondary | ICD-10-CM | POA: Diagnosis not present

## 2017-02-19 DIAGNOSIS — R569 Unspecified convulsions: Secondary | ICD-10-CM | POA: Diagnosis not present

## 2017-02-20 DIAGNOSIS — I10 Essential (primary) hypertension: Secondary | ICD-10-CM | POA: Diagnosis not present

## 2017-02-20 DIAGNOSIS — D509 Iron deficiency anemia, unspecified: Secondary | ICD-10-CM | POA: Diagnosis not present

## 2017-02-20 DIAGNOSIS — Z8639 Personal history of other endocrine, nutritional and metabolic disease: Secondary | ICD-10-CM | POA: Diagnosis not present

## 2017-02-20 DIAGNOSIS — E559 Vitamin D deficiency, unspecified: Secondary | ICD-10-CM | POA: Diagnosis not present

## 2017-04-02 DIAGNOSIS — N390 Urinary tract infection, site not specified: Secondary | ICD-10-CM | POA: Diagnosis not present

## 2017-04-02 DIAGNOSIS — R300 Dysuria: Secondary | ICD-10-CM | POA: Diagnosis not present

## 2017-04-02 DIAGNOSIS — Z6828 Body mass index (BMI) 28.0-28.9, adult: Secondary | ICD-10-CM | POA: Diagnosis not present

## 2017-04-02 DIAGNOSIS — I10 Essential (primary) hypertension: Secondary | ICD-10-CM | POA: Insufficient documentation

## 2017-04-23 DIAGNOSIS — S93431A Sprain of tibiofibular ligament of right ankle, initial encounter: Secondary | ICD-10-CM | POA: Diagnosis not present

## 2017-05-02 ENCOUNTER — Encounter: Payer: Self-pay | Admitting: Podiatry

## 2017-05-02 ENCOUNTER — Ambulatory Visit (INDEPENDENT_AMBULATORY_CARE_PROVIDER_SITE_OTHER): Payer: BLUE CROSS/BLUE SHIELD | Admitting: Podiatry

## 2017-05-02 ENCOUNTER — Ambulatory Visit (INDEPENDENT_AMBULATORY_CARE_PROVIDER_SITE_OTHER): Payer: BLUE CROSS/BLUE SHIELD

## 2017-05-02 ENCOUNTER — Other Ambulatory Visit: Payer: Self-pay | Admitting: Podiatry

## 2017-05-02 VITALS — BP 125/77 | HR 80 | Resp 16

## 2017-05-02 DIAGNOSIS — M722 Plantar fascial fibromatosis: Secondary | ICD-10-CM

## 2017-05-02 DIAGNOSIS — M79671 Pain in right foot: Secondary | ICD-10-CM

## 2017-05-02 MED ORDER — TRIAMCINOLONE ACETONIDE 10 MG/ML IJ SUSP
10.0000 mg | Freq: Once | INTRAMUSCULAR | Status: AC
  Administered 2017-05-02: 10 mg

## 2017-05-02 MED ORDER — DICLOFENAC SODIUM 75 MG PO TBEC: 75.0000 mg | DELAYED_RELEASE_TABLET | Freq: Two times a day (BID) | ORAL | 2 refills | Status: DC

## 2017-05-02 NOTE — Patient Instructions (Addendum)

## 2017-05-02 NOTE — Progress Notes (Signed)
Subjective:    Patient ID: [name], female   DOB: 61 y.o.   MRN: [id number]   HPI patient presents stating that her foot started to hurt her several months ago and has become increasingly tender with ambulation. States that it's worse now in the bottom of the heel and that it shoots and she does not remember specific injury but she did bend her foot in June and she felt pain shoot up her foot but this is now in the bottom of the foot   Review of Systems  All other systems reviewed and are negative.       Objective:  Physical Exam  Cardiovascular: Intact distal pulses.   Musculoskeletal: Normal range of motion.  Neurological: She is alert.  Skin: Skin is warm.  Nursing note and vitals reviewed. Neurovascular status found to be intact muscle strength adequate range of motion within normal limits. Patient's noted to have exquisite discomfort plantar aspect right heel at the insertional point tendon into the calcaneus with inflammation fluid and I checked all tendon groups and did not note any indications that there is a tear or other pathology. Patient states that this is been a gradual increase in discomfort over the last several months and she does have a history of plantar fasciitis in her other heel     Assessment:    Probable plantar fasciitis right with possibility of acute injury which may have created change in gait leading to this chronic problem but no indication of tendon tear     Plan:    H&P all conditions reviewed and explained. And x-rays reviewed. Today injected the right plantar fascia 3 mg Kenalog 5 mg Xylocaine and applied fascial brace gave instructions on physical therapy and placed on diclofenac 75 mg twice a day. Reappoint for [place] to recheck again in the next several weeks  X-rays indicate that there is small spur formation but no indications of other bony pathology except for some midfoot arthritis

## 2017-05-02 NOTE — Progress Notes (Signed)
   Subjective:    Patient ID: [name], female    DOB: [birth year], 61 y.o.   MRN: [id number]  HPI Chief Complaint  Patient presents with  . Foot Pain    Right foot; back of heel; swelling - dorsal & plantar (medial side); pt stated, "When bent foot in June 2018, pain shot up foot"      Review of Systems  Gastrointestinal: Positive for abdominal distention.  Musculoskeletal: Positive for back pain.  All other systems reviewed and are negative.      Objective:   Physical Exam        Assessment & Plan:

## 2017-05-16 ENCOUNTER — Encounter: Payer: Self-pay | Admitting: Podiatry

## 2017-05-16 ENCOUNTER — Ambulatory Visit (INDEPENDENT_AMBULATORY_CARE_PROVIDER_SITE_OTHER): Payer: BLUE CROSS/BLUE SHIELD | Admitting: Podiatry

## 2017-05-16 DIAGNOSIS — M79671 Pain in right foot: Secondary | ICD-10-CM | POA: Diagnosis not present

## 2017-05-16 DIAGNOSIS — M722 Plantar fascial fibromatosis: Secondary | ICD-10-CM | POA: Diagnosis not present

## 2017-05-16 NOTE — Progress Notes (Signed)
Subjective:    Patient ID: [name], female   DOB: 61 y.o.   MRN: [id number]   HPI patient states my right foot is feeling quite a bit better with discomfort still if I been on it too long but overall improvement    ROS      Objective:  Physical Exam neurovascular status intact with patient noted to have continued inflammatory changes in the right foot and plantar fascial symptomatology right     Assessment:    2 separate problems with one being inflammatory one being tendinitis     Plan:   Reviewed condition and fasciitis and tendinitis and I've advised this patient on anti-inflammatories physical therapy supportive shoes reduced activity and if symptoms persist we'll need to consider more aggressive treatment plan. Reappoint for us to recheck

## 2017-07-02 DIAGNOSIS — Z1211 Encounter for screening for malignant neoplasm of colon: Secondary | ICD-10-CM | POA: Diagnosis not present

## 2017-08-16 ENCOUNTER — Encounter (HOSPITAL_COMMUNITY): Payer: Self-pay

## 2017-11-28 DIAGNOSIS — M47812 Spondylosis without myelopathy or radiculopathy, cervical region: Secondary | ICD-10-CM | POA: Diagnosis not present

## 2017-11-28 DIAGNOSIS — I10 Essential (primary) hypertension: Secondary | ICD-10-CM | POA: Diagnosis not present

## 2018-03-12 DIAGNOSIS — G43009 Migraine without aura, not intractable, without status migrainosus: Secondary | ICD-10-CM | POA: Insufficient documentation

## 2018-03-19 ENCOUNTER — Other Ambulatory Visit: Payer: Self-pay | Admitting: Nurse Practitioner

## 2018-03-19 DIAGNOSIS — Z1231 Encounter for screening mammogram for malignant neoplasm of breast: Secondary | ICD-10-CM

## 2018-04-10 ENCOUNTER — Ambulatory Visit
Admission: RE | Admit: 2018-04-10 | Discharge: 2018-04-10 | Disposition: A | Discharge status: Home / Self Care | Payer: BLUE CROSS/BLUE SHIELD | Source: Ambulatory Visit | Attending: Nurse Practitioner | Admitting: Nurse Practitioner

## 2018-04-10 DIAGNOSIS — Z1231 Encounter for screening mammogram for malignant neoplasm of breast: Secondary | ICD-10-CM | POA: Diagnosis not present

## 2018-04-10 IMAGING — MG DIGITAL SCREENING BILATERAL MAMMOGRAM WITH CAD
4 series · 4 of 4 positions shown · non-contrast
Comparison: Previous exam(s).

CLINICAL DATA: Screening.

EXAM:
DIGITAL SCREENING BILATERAL MAMMOGRAM WITH CAD

[R CC]
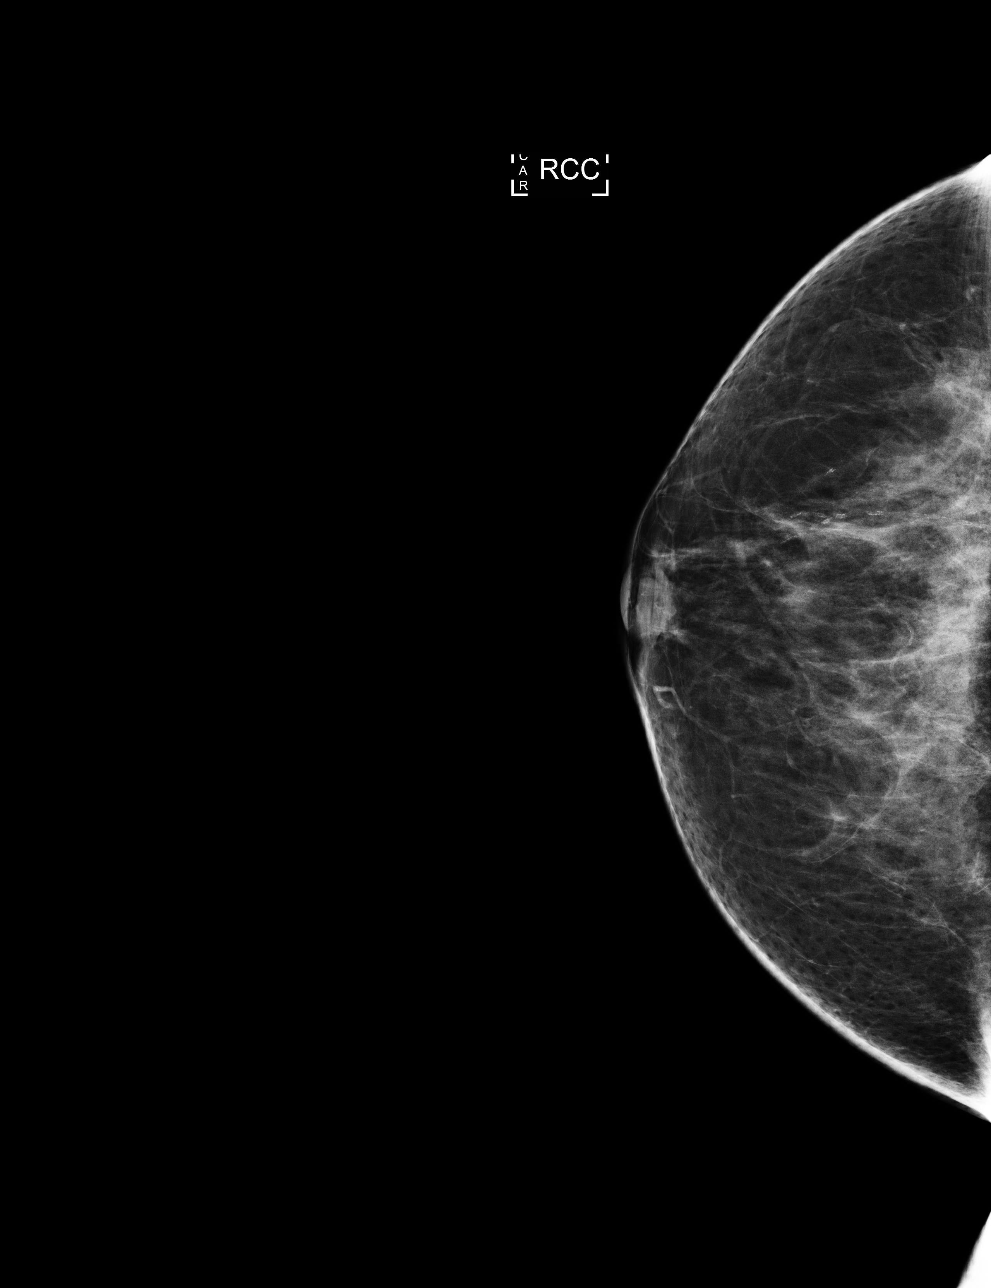

[L MLO]
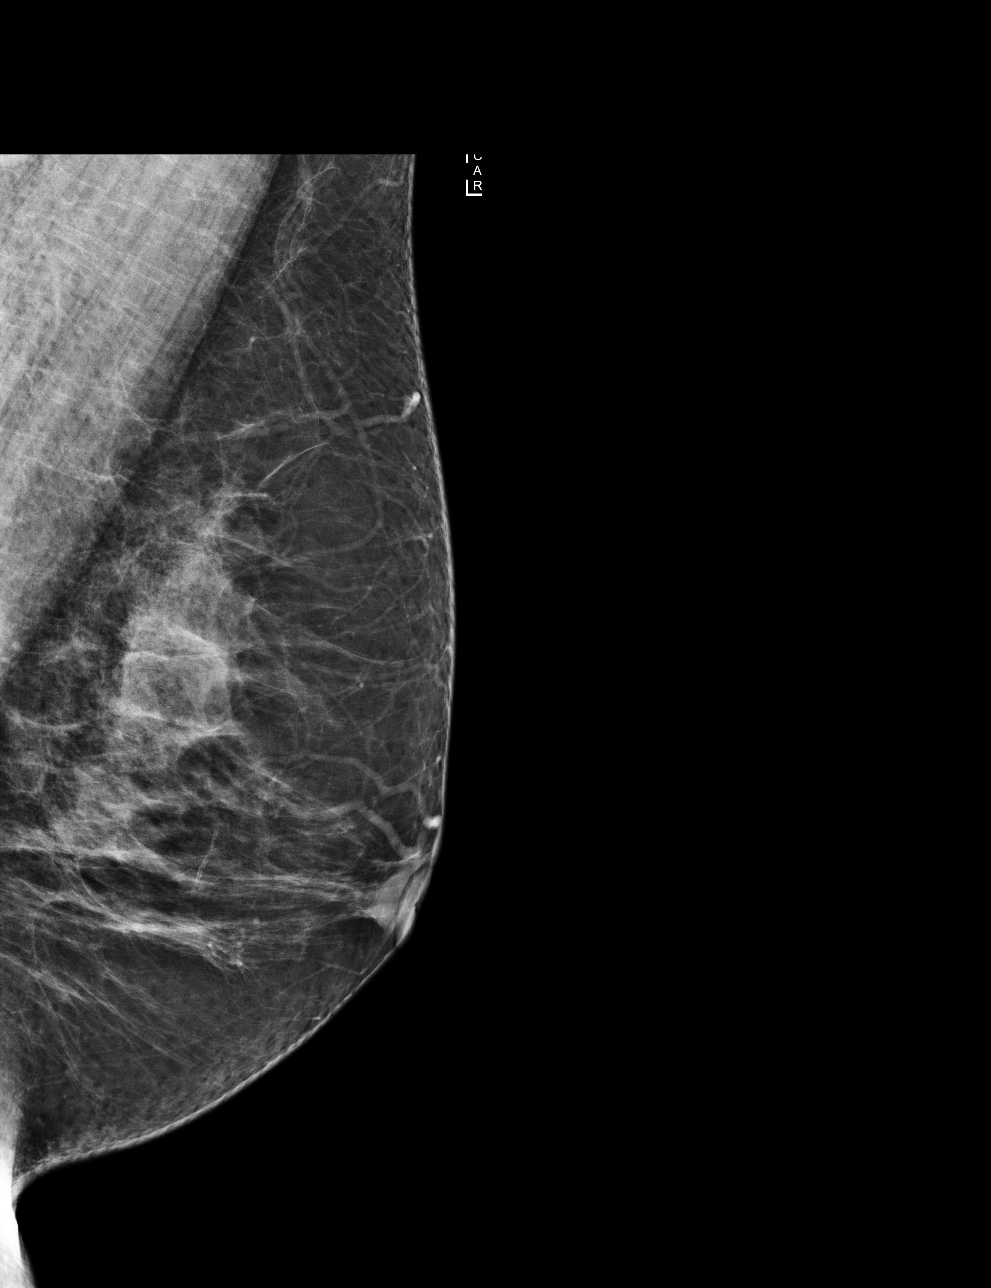

[R MLO]
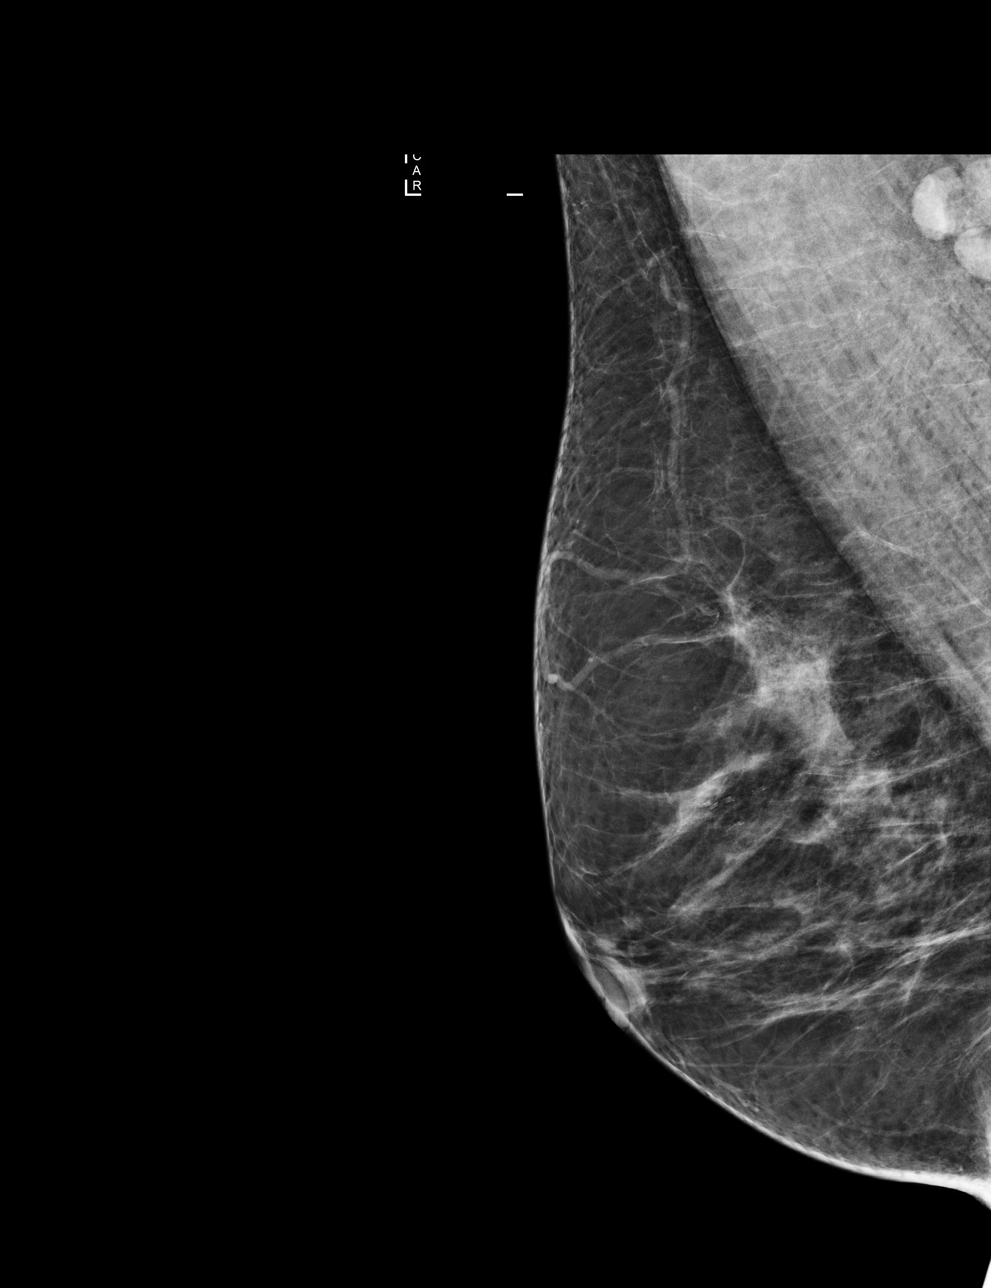

[L CC]
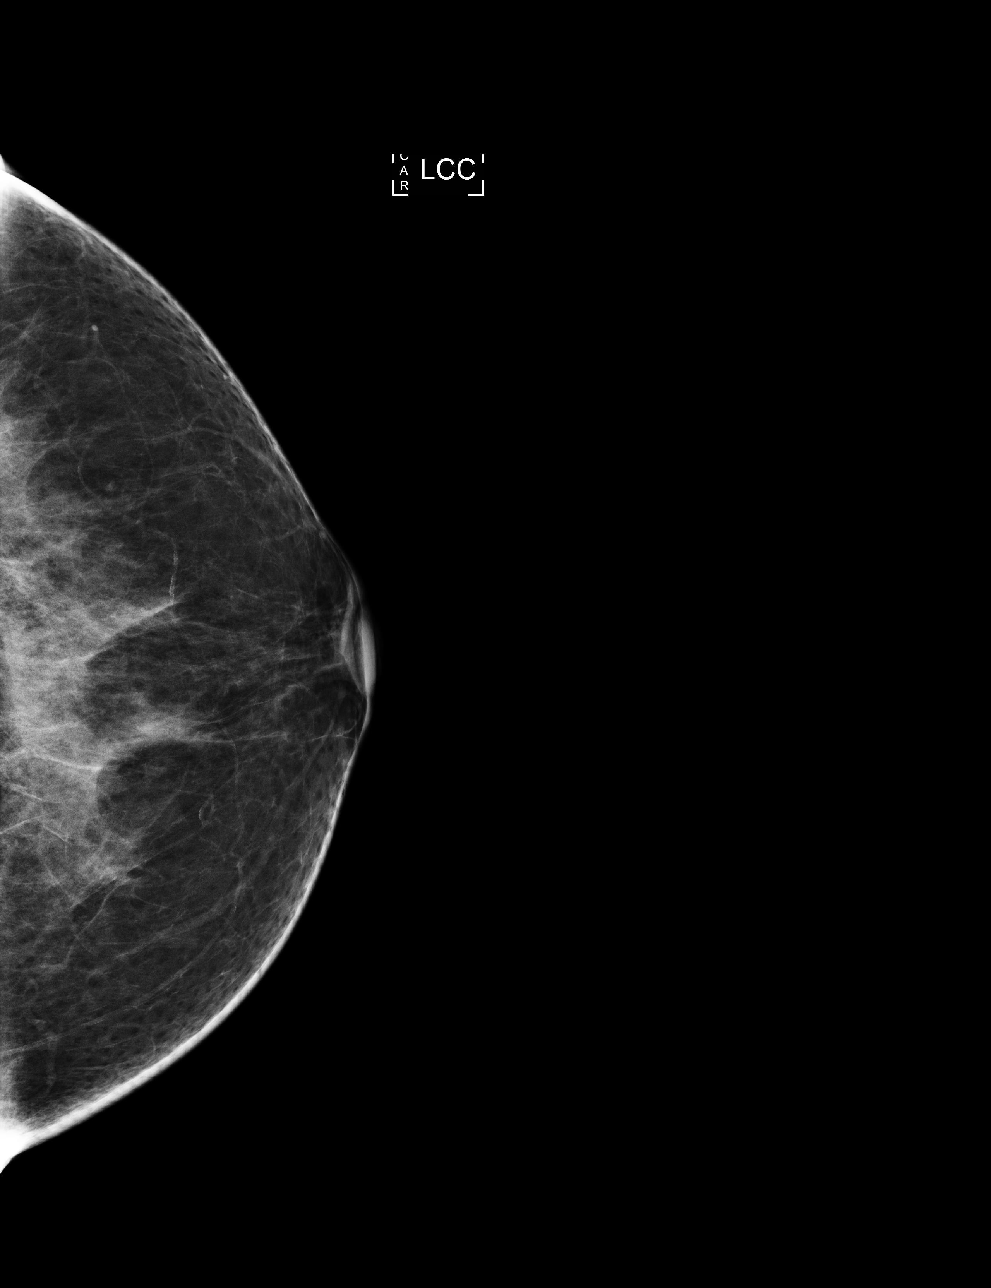

[4 of 4 positions shown; findings below may reference images not displayed]

ACR Breast Density Category b: There are scattered areas of
fibroglandular density.
FINDINGS: There are no findings suspicious for malignancy. Images were
processed with CAD.
IMPRESSION: No mammographic evidence of malignancy. A result letter of this
screening mammogram will be mailed directly to the patient.

RECOMMENDATION:
Screening mammogram in one year. (Code:AS-G-LCT)

BI-RADS CATEGORY  1: Negative.

## 2018-04-30 DIAGNOSIS — Z79899 Other long term (current) drug therapy: Secondary | ICD-10-CM | POA: Diagnosis not present

## 2018-04-30 DIAGNOSIS — G40409 Other generalized epilepsy and epileptic syndromes, not intractable, without status epilepticus: Secondary | ICD-10-CM | POA: Diagnosis not present

## 2018-04-30 DIAGNOSIS — G40209 Localization-related (focal) (partial) symptomatic epilepsy and epileptic syndromes with complex partial seizures, not intractable, without status epilepticus: Secondary | ICD-10-CM | POA: Diagnosis not present

## 2018-08-29 DIAGNOSIS — H9202 Otalgia, left ear: Secondary | ICD-10-CM | POA: Diagnosis not present

## 2018-08-29 DIAGNOSIS — I10 Essential (primary) hypertension: Secondary | ICD-10-CM | POA: Diagnosis not present

## 2018-08-29 DIAGNOSIS — M549 Dorsalgia, unspecified: Secondary | ICD-10-CM | POA: Diagnosis not present

## 2018-09-20 ENCOUNTER — Encounter: Payer: Self-pay | Admitting: Obstetrics

## 2018-09-20 ENCOUNTER — Ambulatory Visit (INDEPENDENT_AMBULATORY_CARE_PROVIDER_SITE_OTHER): Payer: BLUE CROSS/BLUE SHIELD | Admitting: Obstetrics

## 2018-09-20 VITALS — BP 142/89 | HR 66 | Ht 65.0 in | Wt 181.0 lb

## 2018-09-20 DIAGNOSIS — Z124 Encounter for screening for malignant neoplasm of cervix: Secondary | ICD-10-CM

## 2018-09-20 DIAGNOSIS — Z01419 Encounter for gynecological examination (general) (routine) without abnormal findings: Secondary | ICD-10-CM

## 2018-09-20 NOTE — Progress Notes (Signed)
Presents for AEX/PAP.  C/o headaches, blurry vision, she sees her PCP on 09/25/18.

## 2018-09-20 NOTE — Addendum Note (Signed)
Addended by: Maretta Bees on: 09/20/2018 10:51 AM   Modules accepted: Orders

## 2018-09-20 NOTE — Progress Notes (Signed)
Subjective:        [name] is a 63 y.o. female here for a routine exam.  Current complaints:  Headaches and blurred vision.    Personal health questionnaire:  Is patient Ashkenazi Jewish, have a family history of breast and/or ovarian cancer: no Is there a family history of uterine cancer diagnosed at age < 59, gastrointestinal cancer, urinary tract cancer, family member who is a Personnel officer syndrome-associated carrier: no Is the patient overweight and hypertensive, family history of diabetes, personal history of gestational diabetes, preeclampsia or PCOS: no Is patient over 46, have PCOS,  family history of premature CHD under age 69, diabetes, smoke, have hypertension or peripheral artery disease:  no At any time, has a partner hit, kicked or otherwise hurt or frightened you?: no Over the past 2 weeks, have you felt down, depressed or hopeless?: no Over the past 2 weeks, have you felt little interest or pleasure in doing things?:no   Gynecologic History No LMP recorded. Patient is postmenopausal. Contraception: post menopausal status Last Pap: 2017. Results were: normal Last mammogram: 2019. Results were: normal  Obstetric History OB History  Gravida Para Term Preterm AB Living  3         2  SAB TAB Ectopic Multiple Live Births               # Outcome Date GA Lbr Len/2nd Weight Sex Delivery Anes PTL Lv  3 Gravida           2 Gravida           1 [place]             Past Medical History:  Diagnosis Date  . Connective tissue disorder (HCC)   . Hypertension   . Seizures (HCC)     Past Surgical History:  Procedure Laterality Date  . CHOLECYSTECTOMY    . HERNIA REPAIR    . left foot surgery    . TUBAL LIGATION       Current Outpatient Medications:  .  carbamazepine (TEGRETOL XR) 400 MG 12 hr tablet, Take 200 mg by mouth 2 (two) times daily. , Disp: , Rfl:  .  Cholecalciferol (VITAMIN D PO), Take 1 tablet by mouth as needed., Disp: , Rfl:  .  cyclobenzaprine  (FLEXERIL) 10 MG tablet, Take 1 tablet (10 mg total) by mouth 3 (three) times daily as needed for muscle spasms. (Patient not taking: Reported on 09/20/2018), Disp: 15 tablet, Rfl: 0 .  diclofenac (VOLTAREN) 75 MG EC tablet, Take 1 tablet (75 mg total) by mouth 2 (two) times daily., Disp: 50 tablet, Rfl: 2 .  ferrous sulfate 325 (65 FE) MG EC tablet, Take 325 mg by mouth 2 (two) times daily., Disp: , Rfl:  .  triamterene-hydrochlorothiazide (MAXZIDE-25) 37.5-25 MG per tablet, Take 1 tablet by mouth daily., Disp: , Rfl:  .  UNABLE TO FIND, Five Alive - takes 1 tablet every day, Disp: , Rfl:  Allergies  Allergen Reactions  . Codeine   . Dilantin [Phenytoin Sodium Extended]   . Librium [Chlordiazepoxide Hcl]   . Tylenol [Acetaminophen] Other (See Comments)    Sensitivity- may take 1-2 doses- but has chest pain after that    Social History   Tobacco Use  . Smoking status: Never Smoker  . Smokeless tobacco: Never Used  Substance Use Topics  . Alcohol use: No    Family History  Problem Relation Age of Onset  . Cancer Mother  lung  . Hypertension Mother   . Cancer Father        prostate  . Cancer Brother        lung and prostate  . Cancer Maternal Grandmother        lung      Review of Systems  Constitutional: negative for fatigue and weight loss Respiratory: negative for cough and wheezing Cardiovascular: negative for chest pain, fatigue and palpitations Gastrointestinal: negative for abdominal pain and change in bowel habits Musculoskeletal:negative for myalgias Neurological: negative for gait problems and tremors Behavioral/Psych: negative for abusive relationship, depression Endocrine: negative for temperature intolerance    Genitourinary:negative for abnormal menstrual periods, genital lesions, hot flashes, sexual problems and vaginal discharge Integument/breast: negative for breast lump, breast tenderness, nipple discharge and skin lesion(s)    Objective:        BP (!) 142/89   Pulse 66   Ht 5\' 5"  (1.651 m)   Wt 181 lb (82.1 kg)   BMI 30.12 kg/m  General:   alert  Skin:   no rash or abnormalities  Lungs:   clear to auscultation bilaterally  Heart:   regular rate and rhythm, S1, S2 normal, no murmur, click, rub or gallop  Breasts:   normal without suspicious masses, skin or nipple changes or axillary nodes  Abdomen:  normal findings: no organomegaly, soft, non-tender and no hernia  Pelvis:  External genitalia: normal general appearance Urinary system: urethral meatus normal and bladder without fullness, nontender Vaginal: normal without tenderness, induration or masses Cervix: normal appearance Adnexa: normal bimanual exam Uterus: anteverted and non-tender, normal size   Lab Review Urine pregnancy test Labs reviewed yes Radiologic studies reviewed yes  50% of 20 min visit spent on counseling and coordination of care.   Assessment:     1. Encounter for routine gynecological examination with Papanicolaou smear of cervix    Plan:    Education reviewed: calcium supplements, depression evaluation, low fat, low cholesterol diet, safe sex/STD prevention, self breast exams and weight bearing exercise. Follow up in: 2 years.   No orders of the defined types were placed in this encounter.  No orders of the defined types were placed in this encounter.   Brock BadHARLES A. HARPER MD 09-20-2018

## 2018-09-24 LAB — CYTOLOGY - PAP: Diagnosis: NEGATIVE

## 2018-09-26 ENCOUNTER — Other Ambulatory Visit: Payer: Self-pay | Admitting: Nurse Practitioner

## 2018-09-26 ENCOUNTER — Ambulatory Visit
Admission: RE | Admit: 2018-09-26 | Discharge: 2018-09-26 | Disposition: A | Discharge status: Home / Self Care | Payer: BLUE CROSS/BLUE SHIELD | Source: Ambulatory Visit | Attending: Nurse Practitioner | Admitting: Nurse Practitioner

## 2018-09-26 DIAGNOSIS — D8989 Other specified disorders involving the immune mechanism, not elsewhere classified: Secondary | ICD-10-CM | POA: Diagnosis not present

## 2018-09-26 DIAGNOSIS — M549 Dorsalgia, unspecified: Secondary | ICD-10-CM

## 2018-09-26 DIAGNOSIS — I10 Essential (primary) hypertension: Secondary | ICD-10-CM | POA: Diagnosis not present

## 2018-09-26 DIAGNOSIS — M533 Sacrococcygeal disorders, not elsewhere classified: Secondary | ICD-10-CM | POA: Diagnosis not present

## 2018-09-26 DIAGNOSIS — M47812 Spondylosis without myelopathy or radiculopathy, cervical region: Secondary | ICD-10-CM | POA: Diagnosis not present

## 2018-09-26 IMAGING — DX DG SACRUM/COCCYX 2+V
3 series · 3 of 3 positions shown · non-contrast
Comparison: None.

CLINICAL DATA: Chronic pain in the sacrum coccyx area.

EXAM:
SACRUM AND COCCYX - 2+ VIEW

[dg sacrum/coccyx (1 of 3)]
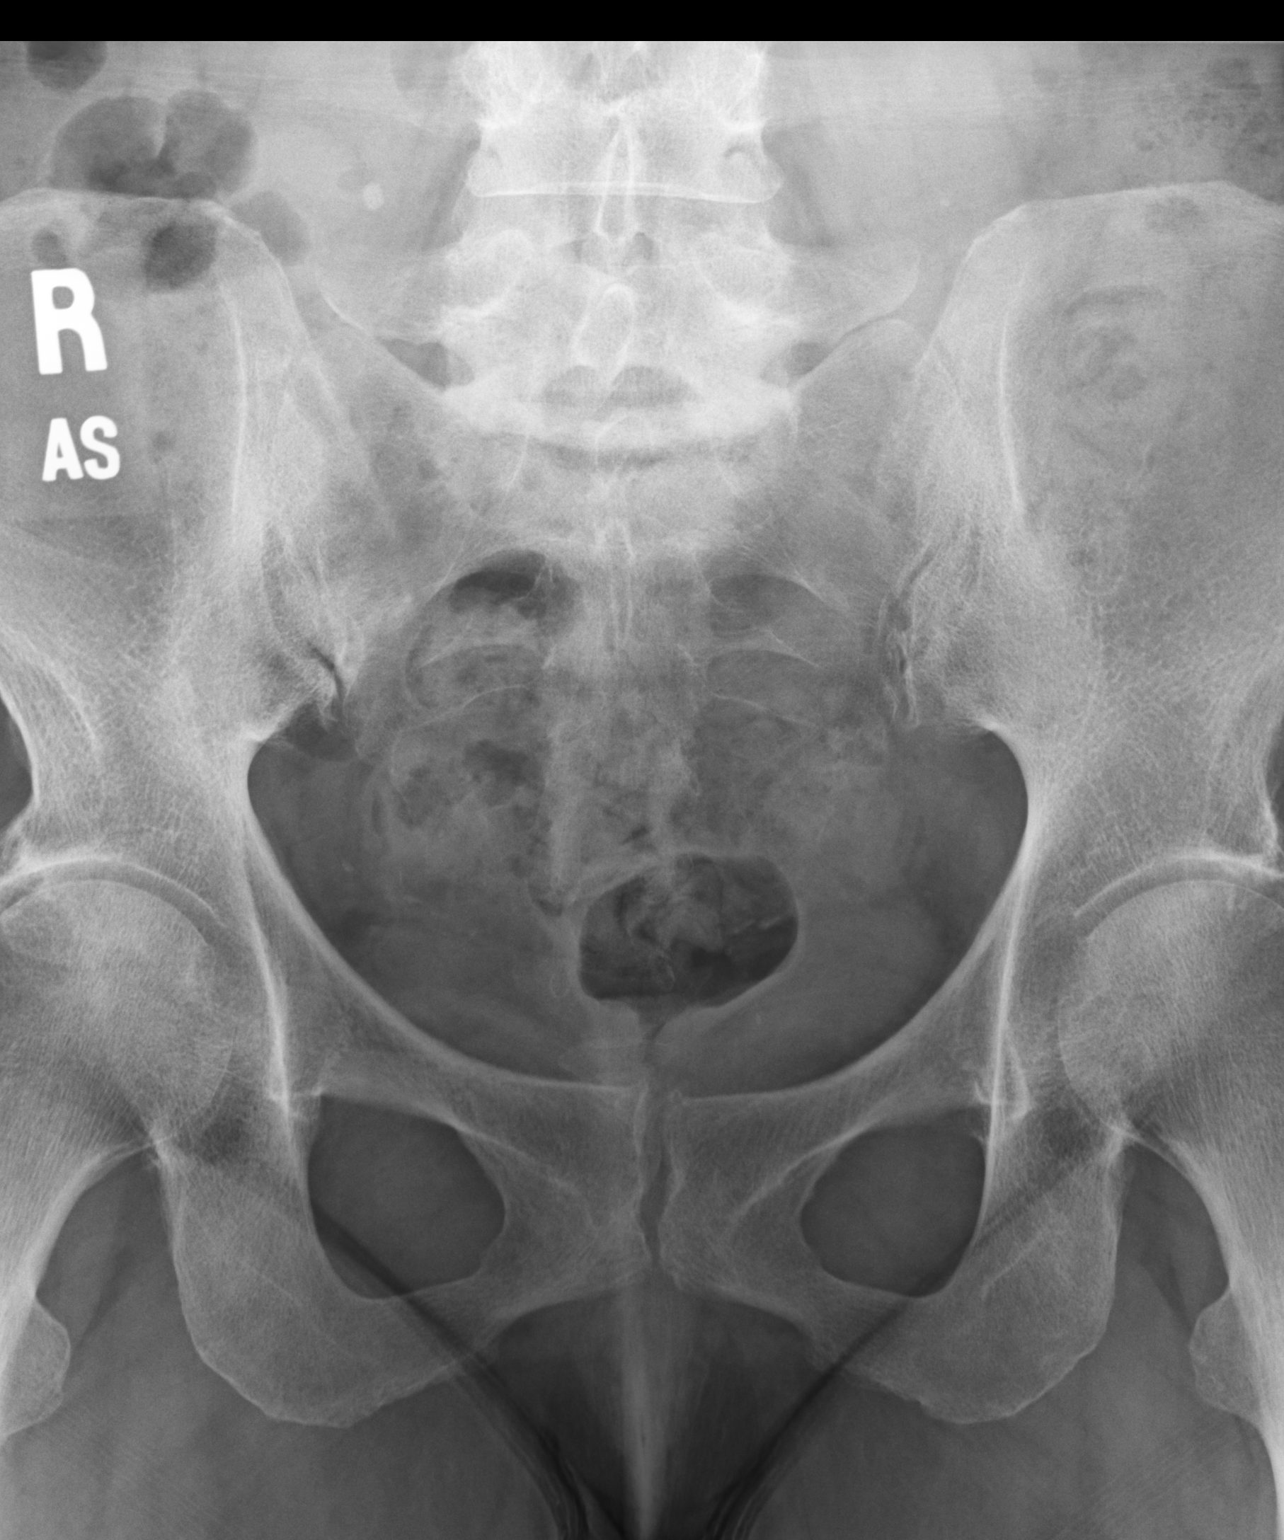

[dg sacrum/coccyx (2 of 3)]
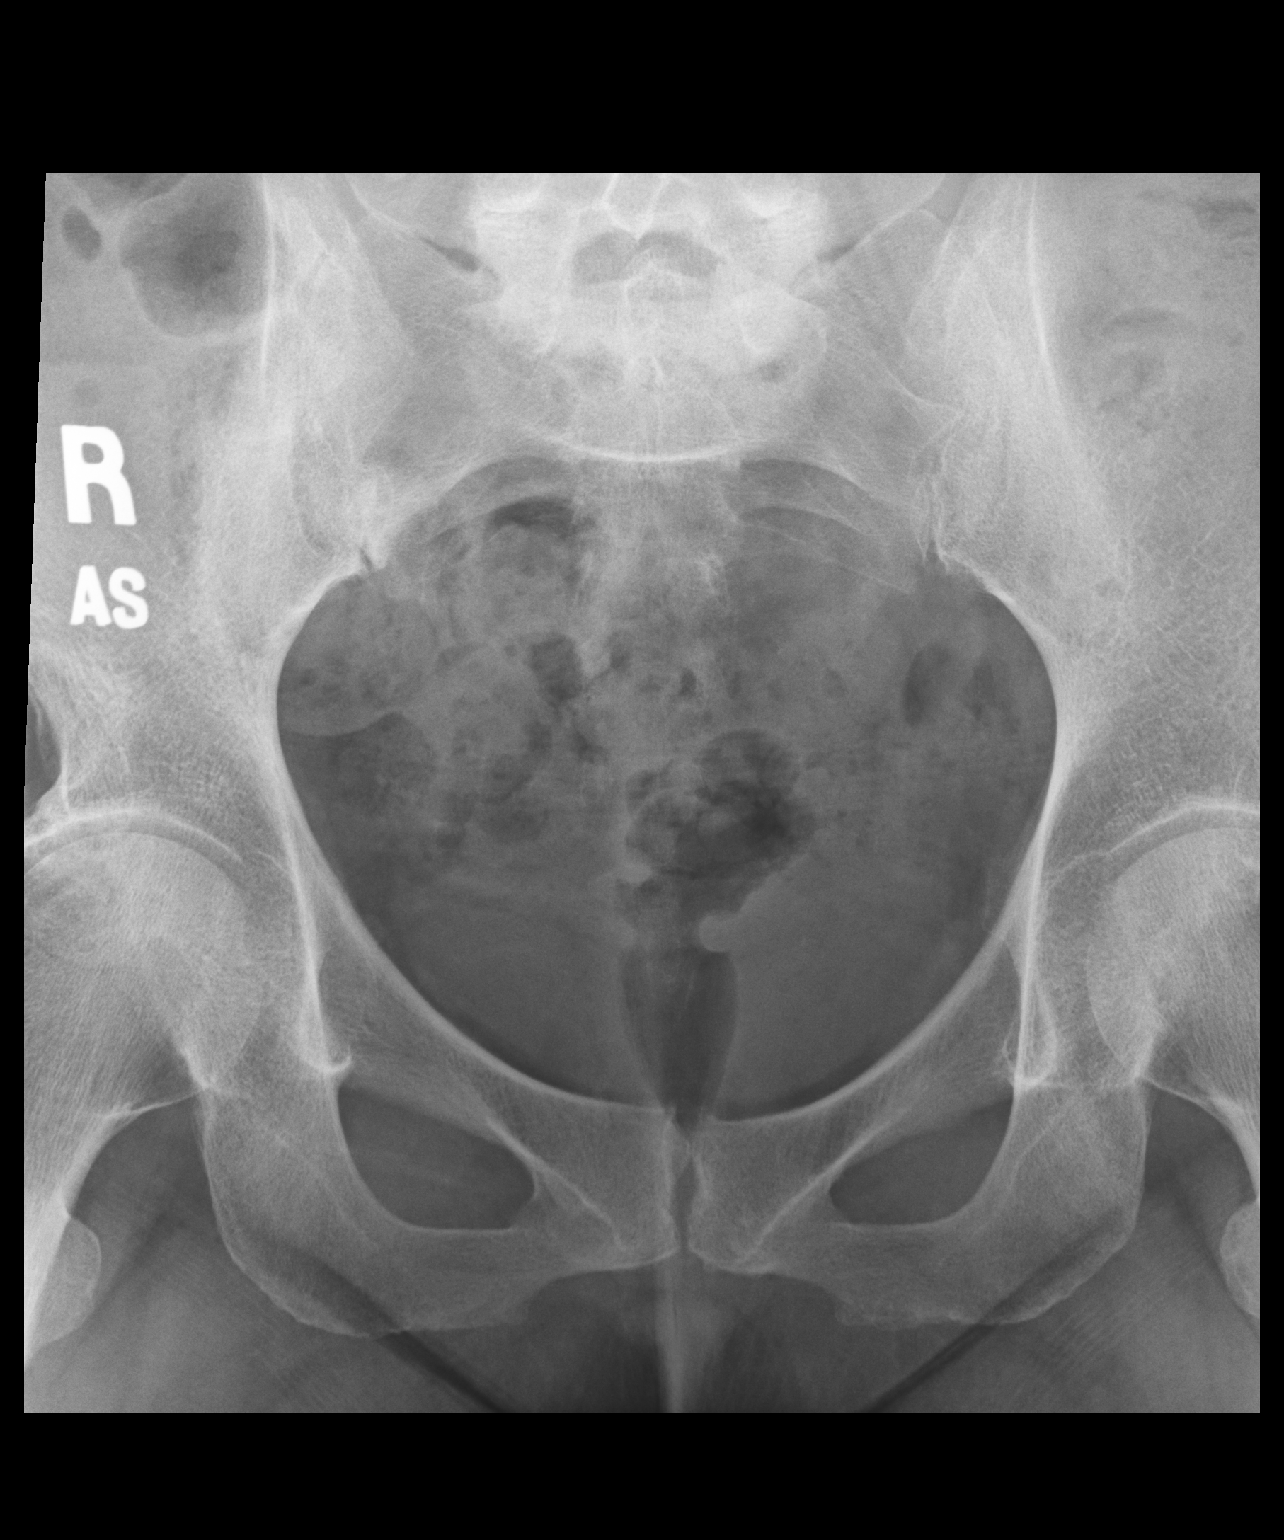

[dg sacrum/coccyx (3 of 3)]
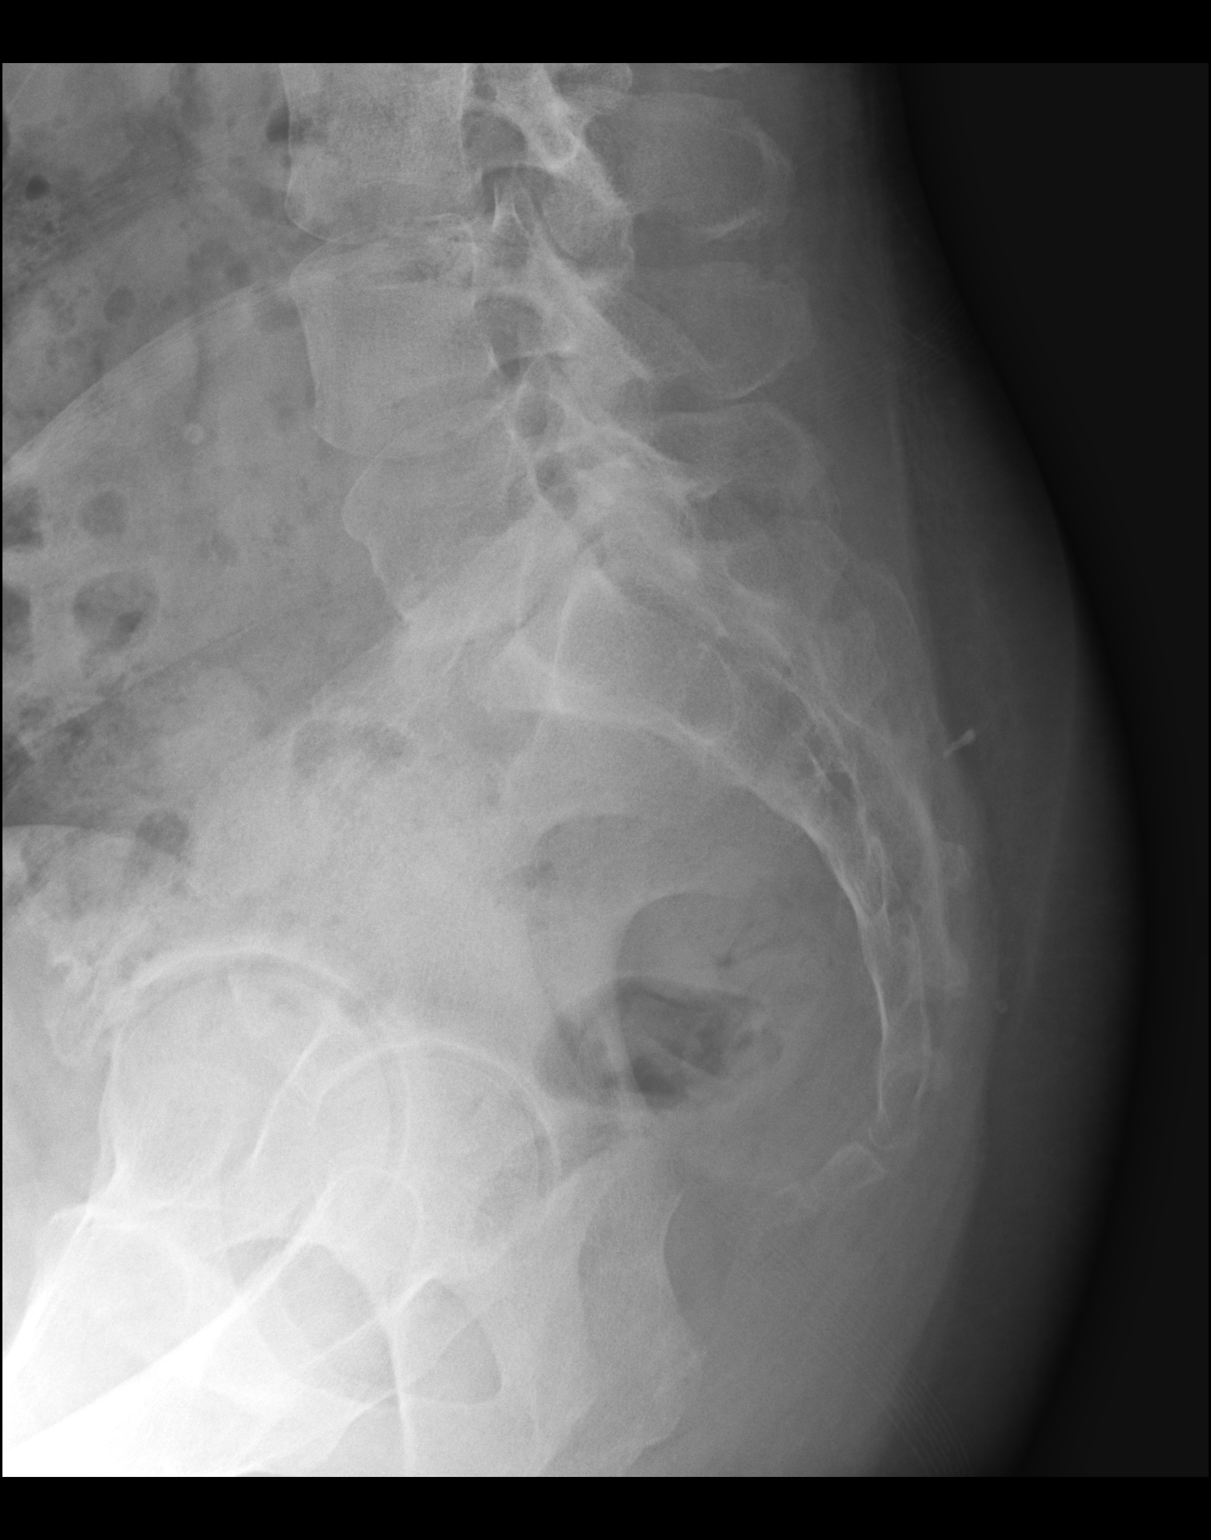

[3 of 3 positions shown; findings below may reference images not displayed]

FINDINGS: There is no evidence of fracture or dislocation.
IMPRESSION: No acute abnormality.

## 2018-10-03 DIAGNOSIS — I7300 Raynaud's syndrome without gangrene: Secondary | ICD-10-CM | POA: Diagnosis not present

## 2018-10-03 DIAGNOSIS — R5383 Other fatigue: Secondary | ICD-10-CM | POA: Diagnosis not present

## 2018-10-03 DIAGNOSIS — M2550 Pain in unspecified joint: Secondary | ICD-10-CM | POA: Diagnosis not present

## 2018-10-03 DIAGNOSIS — M359 Systemic involvement of connective tissue, unspecified: Secondary | ICD-10-CM | POA: Diagnosis not present

## 2018-10-03 DIAGNOSIS — D8989 Other specified disorders involving the immune mechanism, not elsewhere classified: Secondary | ICD-10-CM | POA: Diagnosis not present

## 2018-10-08 DIAGNOSIS — M5136 Other intervertebral disc degeneration, lumbar region: Secondary | ICD-10-CM | POA: Diagnosis not present

## 2018-10-18 DIAGNOSIS — I7300 Raynaud's syndrome without gangrene: Secondary | ICD-10-CM | POA: Diagnosis not present

## 2018-10-18 DIAGNOSIS — R5383 Other fatigue: Secondary | ICD-10-CM | POA: Diagnosis not present

## 2018-10-18 DIAGNOSIS — M359 Systemic involvement of connective tissue, unspecified: Secondary | ICD-10-CM | POA: Diagnosis not present

## 2018-10-18 DIAGNOSIS — M2550 Pain in unspecified joint: Secondary | ICD-10-CM | POA: Diagnosis not present

## 2018-10-22 DIAGNOSIS — E559 Vitamin D deficiency, unspecified: Secondary | ICD-10-CM | POA: Diagnosis not present

## 2018-10-22 DIAGNOSIS — D8989 Other specified disorders involving the immune mechanism, not elsewhere classified: Secondary | ICD-10-CM | POA: Diagnosis not present

## 2018-10-22 DIAGNOSIS — I10 Essential (primary) hypertension: Secondary | ICD-10-CM | POA: Diagnosis not present

## 2019-01-07 IMAGING — MG DIGITAL SCREENING BILATERAL MAMMOGRAM WITH CAD
4 series · 4 of 4 positions shown · non-contrast
Comparison: Previous exam(s).

CLINICAL DATA: Screening.

EXAM:
DIGITAL SCREENING BILATERAL MAMMOGRAM WITH CAD

[R CC]
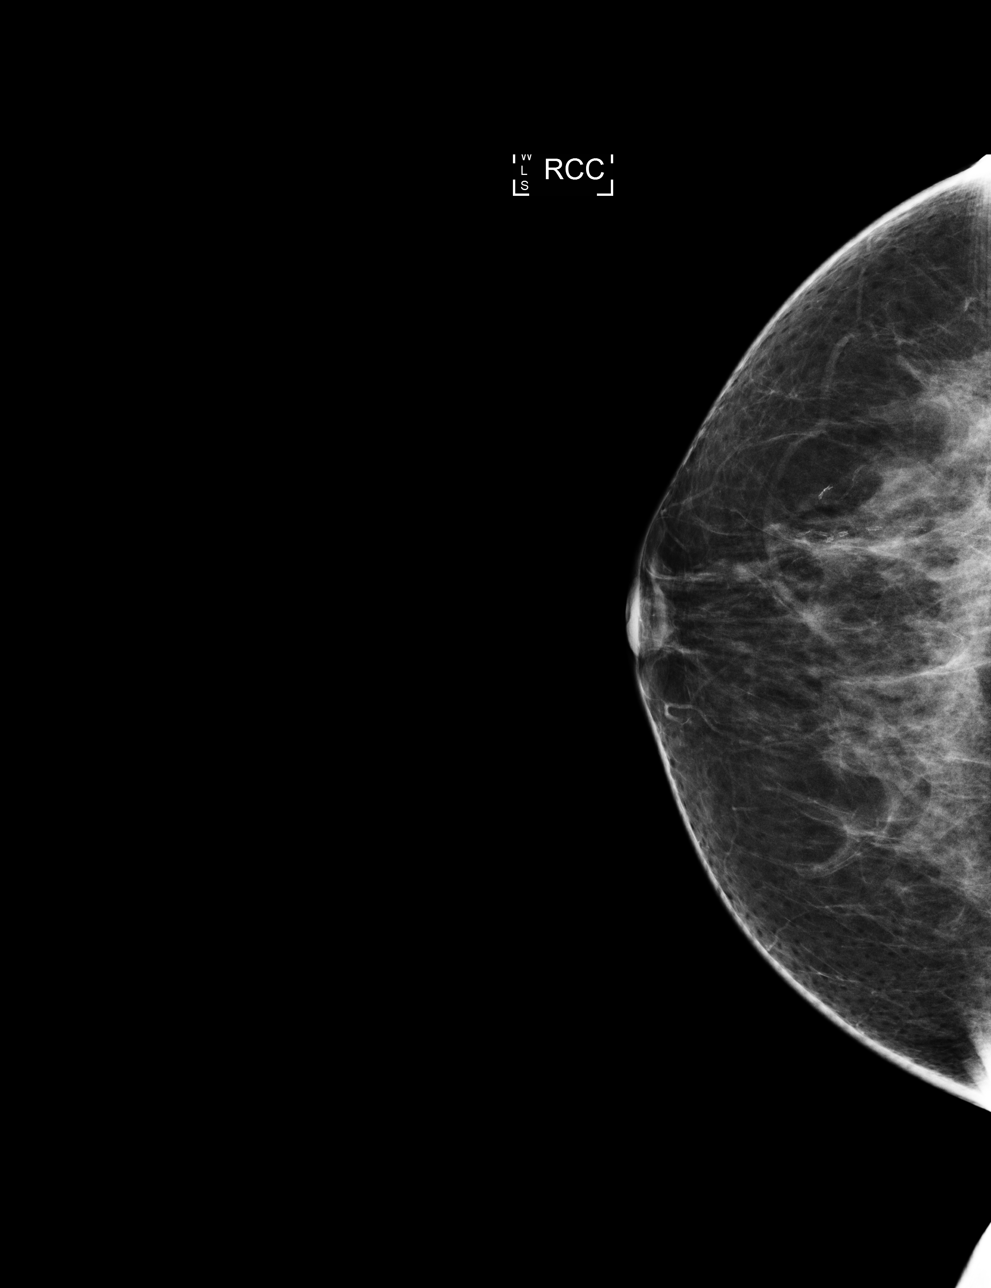

[R MLO]
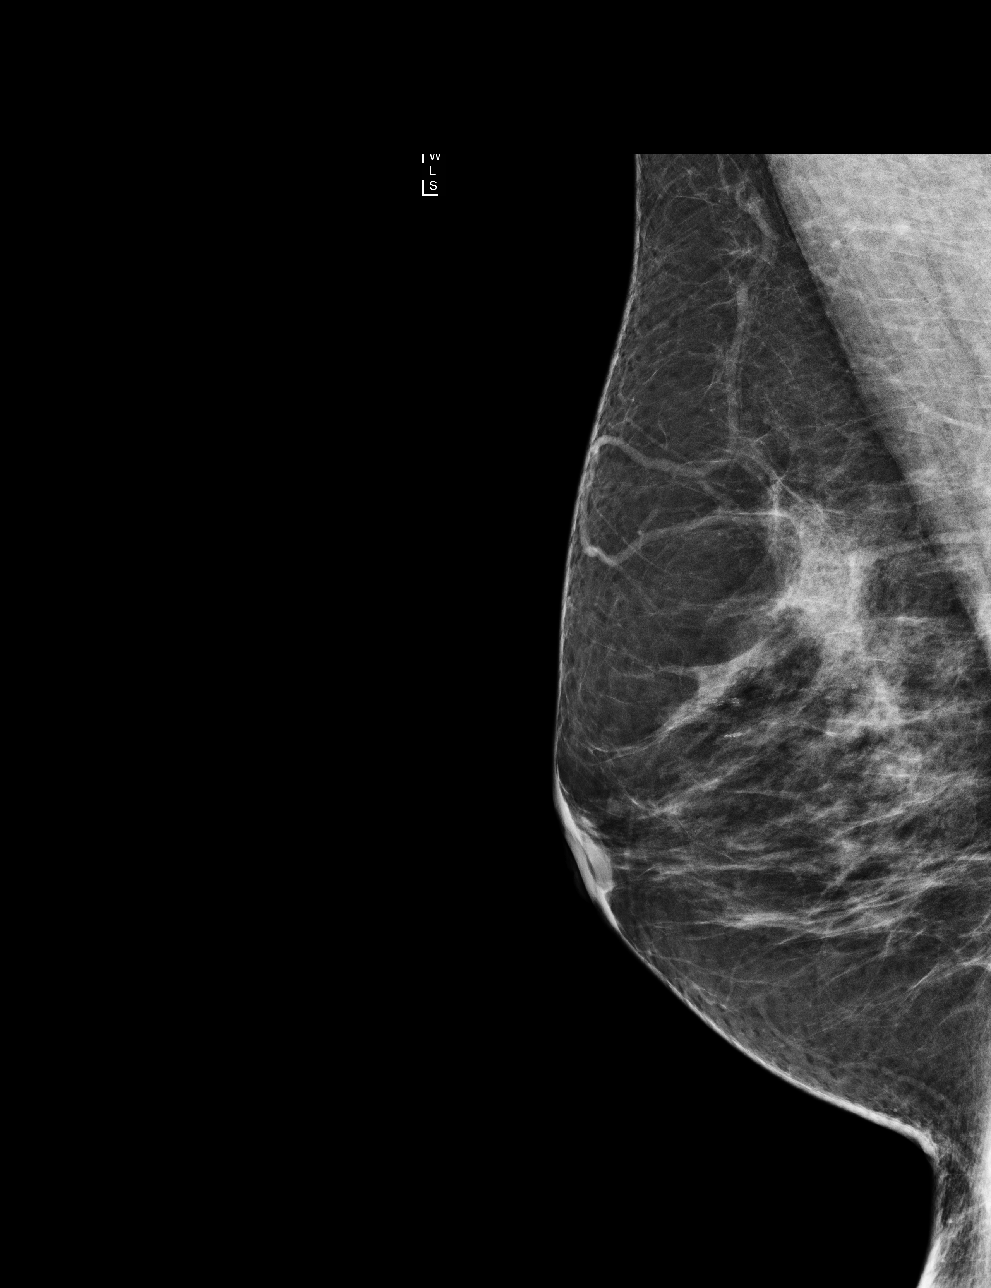

[L CC]
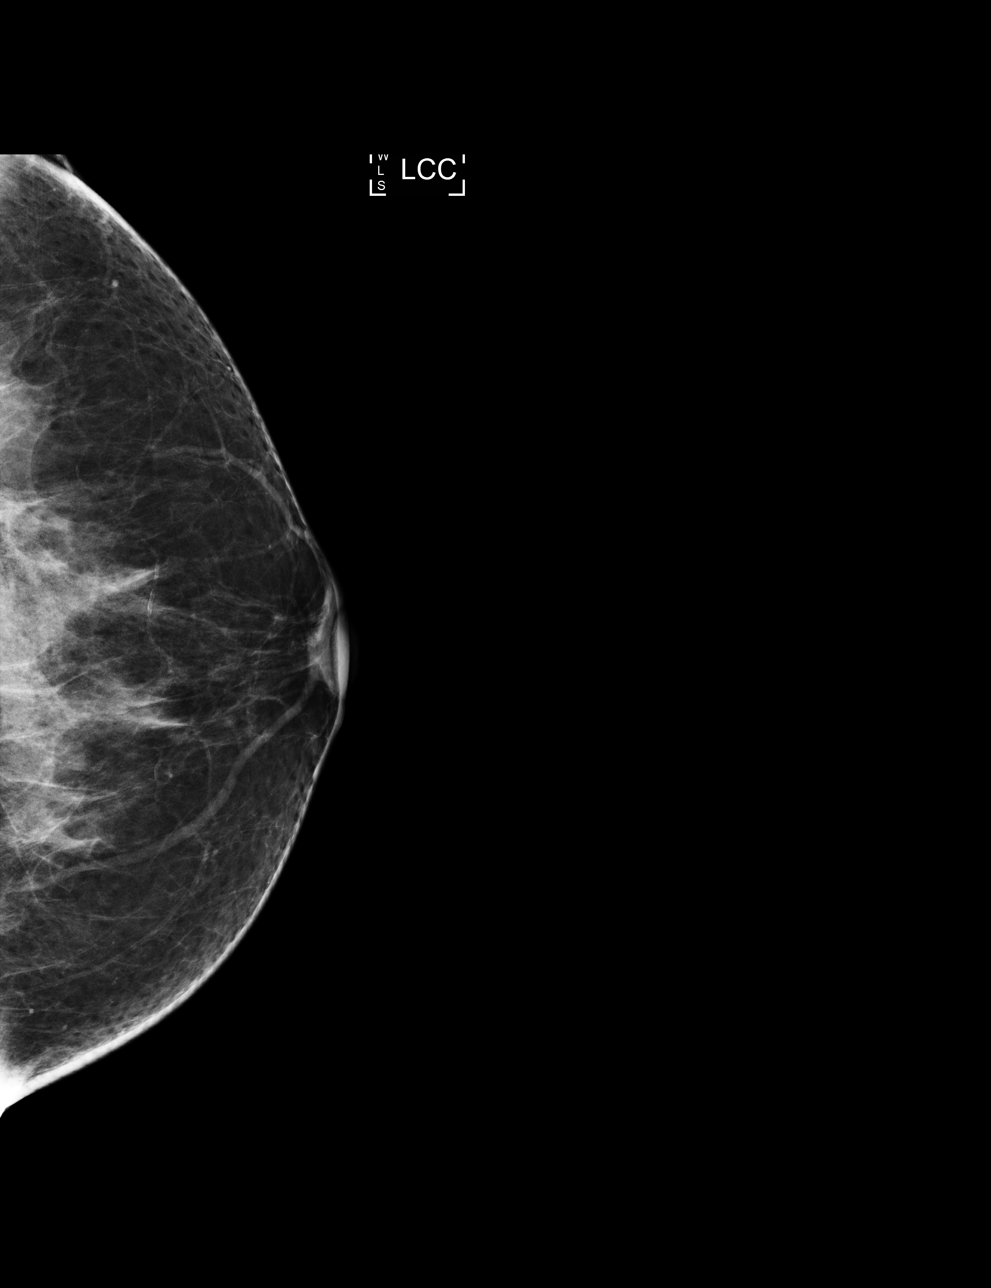

[L MLO]
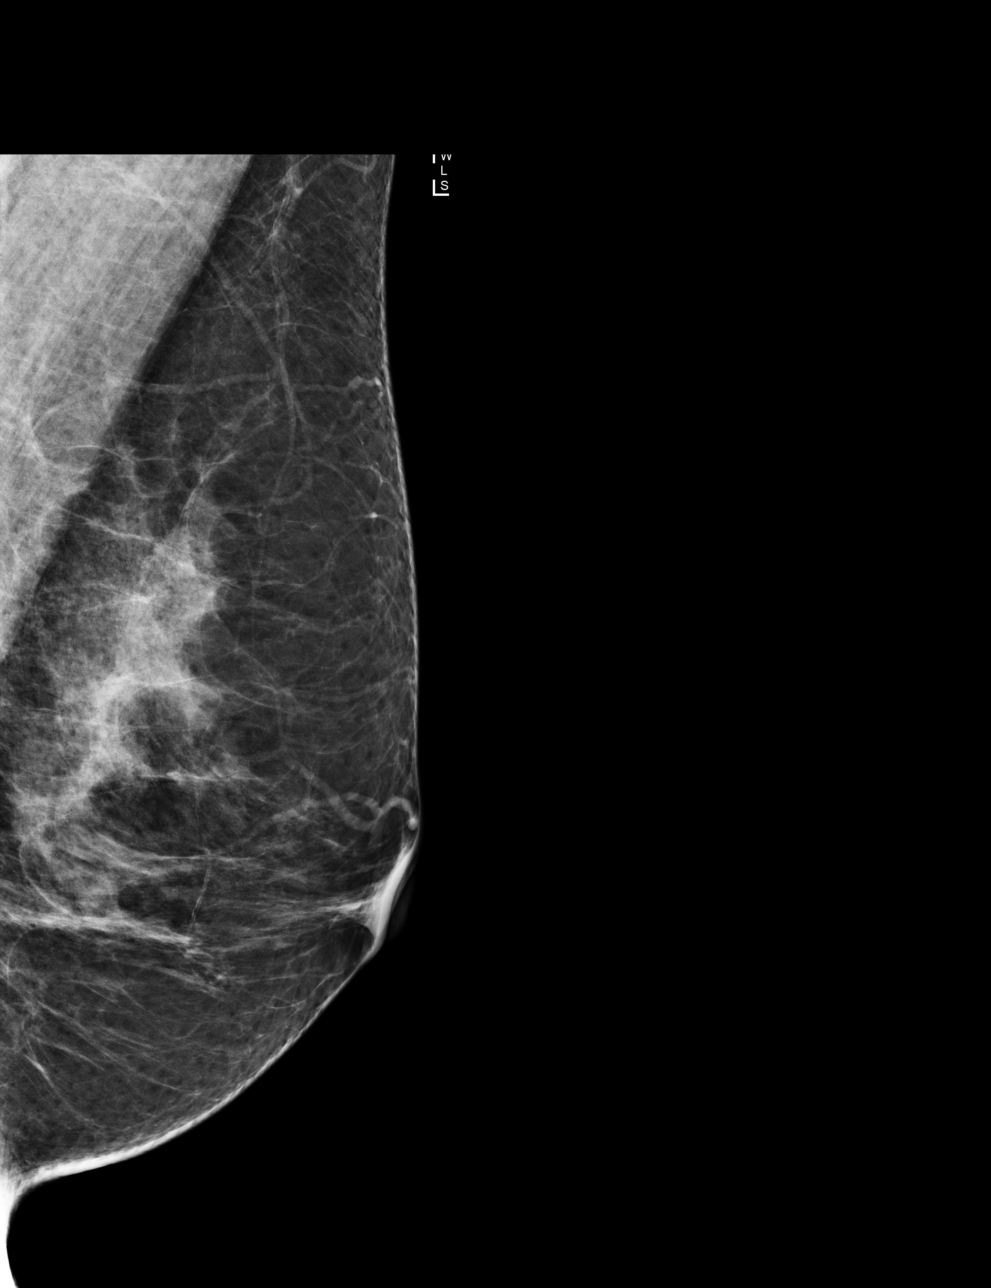

[4 of 4 positions shown; findings below may reference images not displayed]

ACR Breast Density Category b: There are scattered areas of
fibroglandular density.
FINDINGS: There are no findings suspicious for malignancy. Images were
processed with CAD.
IMPRESSION: No mammographic evidence of malignancy. A result letter of this
screening mammogram will be mailed directly to the patient.

RECOMMENDATION:
Screening mammogram in one year. (Code:AS-G-LCT)

BI-RADS CATEGORY  1: Negative.

## 2019-04-29 DIAGNOSIS — N390 Urinary tract infection, site not specified: Secondary | ICD-10-CM | POA: Diagnosis not present

## 2019-04-30 DIAGNOSIS — N390 Urinary tract infection, site not specified: Secondary | ICD-10-CM | POA: Diagnosis not present

## 2019-06-19 DIAGNOSIS — M351 Other overlap syndromes: Secondary | ICD-10-CM | POA: Insufficient documentation

## 2019-06-19 DIAGNOSIS — Z23 Encounter for immunization: Secondary | ICD-10-CM | POA: Diagnosis not present

## 2019-06-19 DIAGNOSIS — Z0000 Encounter for general adult medical examination without abnormal findings: Secondary | ICD-10-CM | POA: Diagnosis not present

## 2019-06-19 DIAGNOSIS — K111 Hypertrophy of salivary gland: Secondary | ICD-10-CM | POA: Diagnosis not present

## 2019-06-19 DIAGNOSIS — G40209 Localization-related (focal) (partial) symptomatic epilepsy and epileptic syndromes with complex partial seizures, not intractable, without status epilepticus: Secondary | ICD-10-CM | POA: Diagnosis not present

## 2019-06-19 DIAGNOSIS — G40909 Epilepsy, unspecified, not intractable, without status epilepticus: Secondary | ICD-10-CM | POA: Diagnosis not present

## 2019-06-19 DIAGNOSIS — K118 Other diseases of salivary glands: Secondary | ICD-10-CM | POA: Diagnosis not present

## 2019-06-19 DIAGNOSIS — M7918 Myalgia, other site: Secondary | ICD-10-CM | POA: Diagnosis not present

## 2019-06-19 DIAGNOSIS — I10 Essential (primary) hypertension: Secondary | ICD-10-CM | POA: Diagnosis not present

## 2019-11-20 ENCOUNTER — Ambulatory Visit: Payer: BLUE CROSS/BLUE SHIELD | Attending: Family

## 2019-11-20 DIAGNOSIS — Z23 Encounter for immunization: Secondary | ICD-10-CM

## 2019-11-20 NOTE — Progress Notes (Signed)
   Covid-19 Vaccination Clinic  Name:  [name]    MRN: [id number] DOB: [birth year]  11/20/2019  [name] was observed post Covid-19 immunization for 15 minutes without incident. She was provided with Vaccine Information Sheet and instruction to access the V-Safe system.   [name] was instructed to call 911 with any severe reactions post vaccine: Marland Kitchen Difficulty breathing  . Swelling of face and throat  . A fast heartbeat  . A bad rash all over body  . Dizziness and weakness   Immunizations Administered    Name Date Dose VIS Date Route   Moderna COVID-19 Vaccine 11/20/2019  1:18 PM 0.5 mL 08/12/2019 Intramuscular   Manufacturer: Moderna   Lot: [id number]   NDC: 91792-178-37

## 2019-12-23 ENCOUNTER — Ambulatory Visit: Payer: BLUE CROSS/BLUE SHIELD | Attending: Family

## 2019-12-23 DIAGNOSIS — Z23 Encounter for immunization: Secondary | ICD-10-CM

## 2019-12-23 NOTE — Progress Notes (Signed)
   Covid-19 Vaccination Clinic  Name:  JASMEN EMRICH    MRN: [id number] DOB: May 25, 1956  12/23/2019  Ms. Habib was observed post Covid-19 immunization for 15 minutes without incident. She was provided with Vaccine Information Sheet and instruction to access the V-Safe system.   Ms. Sanguinetti was instructed to call 911 with any severe reactions post vaccine: Marland Kitchen Difficulty breathing  . Swelling of face and throat  . A fast heartbeat  . A bad rash all over body  . Dizziness and weakness   Immunizations Administered    Name Date Dose VIS Date Route   Moderna COVID-19 Vaccine 12/23/2019  9:57 AM 0.5 mL 08/12/2019 Intramuscular   Manufacturer: Moderna   Lot: [id number]   NDC: 83729-021-11

## 2020-06-24 DIAGNOSIS — H608X3 Other otitis externa, bilateral: Secondary | ICD-10-CM | POA: Insufficient documentation

## 2020-09-29 ENCOUNTER — Other Ambulatory Visit: Payer: Self-pay

## 2020-09-29 ENCOUNTER — Ambulatory Visit: Payer: BLUE CROSS/BLUE SHIELD | Admitting: Obstetrics

## 2020-12-02 DIAGNOSIS — I7300 Raynaud's syndrome without gangrene: Secondary | ICD-10-CM | POA: Insufficient documentation

## 2021-04-11 NOTE — Progress Notes (Signed)
Date:  04/12/2021   ID:  [name], DOB Jul 08, 1956, MRN [id number]  PCP:  Bartholome Bill, MD  Cardiologist:  Rex Kras, DO, [facility]  (established care 04/12/2021)   REASON FOR CONSULT: Near syncope.  REQUESTING PHYSICIAN:  Nolene Ebbs, MD [address] [place],  [place] [address]  Chief Complaint  Patient presents with   New Patient (Initial Visit)   Pre syncope    Fatigue   Chest Pain    HPI  [name] is a 65 y.o. female who presents to the office with a chief complaint of " generalized tired fatigue, chest pain." Patient's past medical history and cardiovascular risk factors include: Former smoker, hypertension.   She is referred to the office at the request of Nolene Ebbs, MD for evaluation of syncope and collapse.  Recently was experiencing generalized tired fatigue, lightheaded and dizziness had followed up with her PCP in July 2022 and is now referred to the office for the evaluation of near syncope.  Patient states that her blood pressure has been trending high and they were titrating her antihypertensive medications but she may have responded to the diuretics too aggressively.  She was found to have hyponatremia, hypomagnesemia which most likely was attributing to her tired/fatigue, near-syncope.  Since then she has stopped taking triamterene as recommended by PCP and her symptoms have improved significantly.  Patient states that randomly she continues to take triamterene/hydrochlorothiazide as her blood pressure numbers have been elevated.  Review of systems are also positive for chest pain.  Patient states that she experiences chest discomfort intermittently, last episode couple days ago, describes it as a shooting like sensation over the left anterior chest wall, lasting for few minutes, intensity 2 out of 10, not brought on by effort related activities, does not resolve with rest, self-limited.  Her last heart catheterization was in 2003 and she had  normal coronaries.  FUNCTIONAL STATUS: No structured exercise program or daily routine.    ALLERGIES: Allergies  Allergen Reactions   Codeine    Dilantin [Phenytoin Sodium Extended]    Librium [Chlordiazepoxide Hcl]    Tylenol [Acetaminophen] Other (See Comments)    Sensitivity- may take 1-2 doses- but has chest pain after that    MEDICATION LIST PRIOR TO VISIT: Current Meds  Medication Sig   amLODipine-valsartan (EXFORGE) 5-160 MG tablet Take 1 tablet by mouth daily.   carbamazepine (TEGRETOL XR) 200 MG 12 hr tablet Take 200 mg by mouth 2 (two) times daily.    Cholecalciferol (VITAMIN D-3) 25 MCG (1000 UT) CAPS Take 1 capsule by mouth daily at 12 noon.   ferrous sulfate 325 (65 FE) MG EC tablet Take 325 mg by mouth 2 (two) times daily.   fluocinonide (LIDEX) 0.05 % external solution Apply 1 application topically as needed.   Magnesium 400 MG CAPS Take 1 capsule by mouth at bedtime.   Multiple Vitamin (MULTIVITAMIN PO) Take 1 tablet by mouth daily at 12 noon. Alive Women   Multiple Vitamins-Minerals (HAIR/SKIN/NAILS/BIOTIN) TABS Take 1 tablet by mouth daily at 12 noon.   triamterene-hydrochlorothiazide (MAXZIDE-25) 37.5-25 MG per tablet Take 1 tablet by mouth daily.     PAST MEDICAL HISTORY: Past Medical History:  Diagnosis Date   Connective tissue disorder ([place])    Hypertension    Seizures ([place])     PAST SURGICAL HISTORY: Past Surgical History:  Procedure Laterality Date   CHOLECYSTECTOMY     HERNIA REPAIR     left foot surgery  TUBAL LIGATION      FAMILY HISTORY: The patient family history includes Cancer in her brother, father, maternal grandmother, and mother; Hypertension in her mother.  SOCIAL HISTORY:  The patient  reports that she has never smoked. She has never used smokeless tobacco. She reports that she does not drink alcohol and does not use drugs.  REVIEW OF SYSTEMS: Review of Systems  Constitutional: Positive for malaise/fatigue (improved.).  Negative for chills and fever.  HENT:  Negative for hoarse voice and nosebleeds.   Eyes:  Negative for discharge, double vision and pain.  Cardiovascular:  Positive for chest pain. Negative for claudication, dyspnea on exertion, leg swelling, near-syncope, orthopnea, palpitations, paroxysmal nocturnal dyspnea and syncope.  Respiratory:  Negative for hemoptysis and shortness of breath.   Musculoskeletal:  Negative for muscle cramps and myalgias.  Gastrointestinal:  Negative for abdominal pain, constipation, diarrhea, hematemesis, hematochezia, melena, nausea and vomiting.  Neurological:  Negative for dizziness and light-headedness.   PHYSICAL EXAM: Vitals with BMI 04/12/2021 09/20/2018 05/02/2017  Height $Remov'5\' 5"'tJbjFm$  $Remove'5\' 5"'lMykohh$  -  Weight 171 lbs 181 lbs -  BMI 48.18 56.31 -  Systolic 497 026 378  Diastolic 82 89 77  Pulse 83 66 80   Orthostatic VS for the past 72 hrs (Last 3 readings):  Orthostatic BP Patient Position BP Location Cuff Size Orthostatic Pulse  04/12/21 0909 160/78 Standing Left Arm Normal 71  04/12/21 0908 156/79 Sitting Left Arm Normal 66  04/12/21 0907 167/80 Supine Left Arm Normal 67   CONSTITUTIONAL: Well-developed and well-nourished. No acute distress.  SKIN: Skin is warm and dry. No rash noted. No cyanosis. No pallor. No jaundice HEAD: Normocephalic and atraumatic.  EYES: No scleral icterus MOUTH/THROAT: Moist oral membranes.  NECK: No JVD present. No thyromegaly noted. No carotid bruits  LYMPHATIC: No visible cervical adenopathy.  CHEST Normal respiratory effort. No intercostal retractions  LUNGS: Clear to auscultation bilaterally. No stridor. No wheezes. No rales.  CARDIOVASCULAR: Regular rate and rhythm, positive S1-S2, no murmurs rubs or gallops appreciated. ABDOMINAL: Soft, nontender, nondistended, positive bowel sounds all 4 quadrants. No apparent ascites.  EXTREMITIES: No peripheral edema  HEMATOLOGIC: No significant bruising NEUROLOGIC: Oriented to person, place, and  time. Nonfocal. Normal muscle tone.  PSYCHIATRIC: Normal mood and affect. Normal behavior. Cooperative  CARDIAC DATABASE: EKG: 04/12/2021: Normal sinus rhythm, 68 bpm, normal axis, nonspecific T wave abnormality, without underlying injury pattern.   Echocardiogram: Greater than 5 years ago.    Stress Testing: None  Heart Catheterization: 11/19 2003 by Dr. Daneen Schick III at [facility]:  1. Essentially normal coronary arteries.  2. Normal left ventricular function.  LABORATORY DATA: No flowsheet data found.  No flowsheet data found.  Lipid Panel  No results found for: CHOL, TRIG, HDL, CHOLHDL, VLDL, LDLCALC, LDLDIRECT, LABVLDL  No components found for: NTPROBNP No results for input(s): PROBNP in the last 8760 hours. No results for input(s): TSH in the last 8760 hours.  BMP No results for input(s): NA, K, CL, CO2, GLUCOSE, BUN, CREATININE, CALCIUM, GFRNONAA, GFRAA in the last 8760 hours.  HEMOGLOBIN A1C No results found for: HGBA1C, MPG  External Labs: Collected: 03/30/2021 available in Care Everywhere Hemoglobin 10.1 g/dL, hematocrit 20.7% Magnesium 1.5 BNP 39 TSH 1.88. Sodium 126, potassium 3.9, chloride 95, bicarb 24, BUN 26 Creatinine 1.36 mg/dL. eGFR: 43 mL/min per 1.73 m AST 19, ALT 16 (normal), alkaline phosphatase 134 (elevated)  06/24/2020: Lipid profile: Total cholesterol 205, triglycerides 84, HDL 62, LDL 126, non-HDL 143.  IMPRESSION:  ICD-10-CM   1. Near syncope  R55 EKG 12-Lead    2. Nonspecific abnormal electrocardiogram (ECG) (EKG)  R94.31 PCV CARDIAC STRESS TEST    PCV ECHOCARDIOGRAM COMPLETE    3. Benign hypertension  I10 PCV ECHOCARDIOGRAM COMPLETE       RECOMMENDATIONS: [name] is a 65 y.o. female whose past medical history and cardiac risk factors include: Former smoker, hypertension.   Near-syncope: Patient office for evaluation of near syncope.  Suspect this was most likely secondary to aggressive diuresis/blood  pressure management.  She has multiple chronic medical including hyponatremia, hypomagnesemia and soft blood pressures.  However since lab work patient informed that her PCP discontinue triamterene/HCTZ.  Since then patient symptoms have improved significantly.  However, given elevated blood pressure she continues to take it on apparent basis.  Advised her to follow-up with her PCP for additional titration of antihypertensive medications if needed.  Precordial pain: Patient's review of systems positive for chest pain.  However, after additional questions suspect chest discomfort is most likely noncardiac.  However, shared decision was to proceed with exercise treadmill stress test and echocardiogram to evaluate for structural heart disease.    Essential hypertension: Currently managed by primary care provider.  Further recommendations to follow as the case evolves. Thank you for allowing [place] to participate in the care of Vandy R Calvi please do not hesitate to reach out if questions or concerns arise.  FINAL MEDICATION LIST END OF ENCOUNTER: No orders of the defined types were placed in this encounter.   Medications Discontinued During This Encounter  Medication Reason   Cholecalciferol (VITAMIN D PO) Error   cyclobenzaprine (FLEXERIL) 10 MG tablet Error   diclofenac (VOLTAREN) 75 MG EC tablet Error   UNABLE TO FIND Error     Current Outpatient Medications:    amLODipine-valsartan (EXFORGE) 5-160 MG tablet, Take 1 tablet by mouth daily., Disp: , Rfl:    carbamazepine (TEGRETOL XR) 200 MG 12 hr tablet, Take 200 mg by mouth 2 (two) times daily. , Disp: , Rfl:    Cholecalciferol (VITAMIN D-3) 25 MCG (1000 UT) CAPS, Take 1 capsule by mouth daily at 12 noon., Disp: , Rfl:    ferrous sulfate 325 (65 FE) MG EC tablet, Take 325 mg by mouth 2 (two) times daily., Disp: , Rfl:    fluocinonide (LIDEX) 0.05 % external solution, Apply 1 application topically as needed., Disp: , Rfl:    Magnesium 400 MG  CAPS, Take 1 capsule by mouth at bedtime., Disp: , Rfl:    Multiple Vitamin (MULTIVITAMIN PO), Take 1 tablet by mouth daily at 12 noon. Alive Women, Disp: , Rfl:    Multiple Vitamins-Minerals (HAIR/SKIN/NAILS/BIOTIN) TABS, Take 1 tablet by mouth daily at 12 noon., Disp: , Rfl:    triamterene-hydrochlorothiazide (MAXZIDE-25) 37.5-25 MG per tablet, Take 1 tablet by mouth daily., Disp: , Rfl:   Orders Placed This Encounter  Procedures   PCV CARDIAC STRESS TEST   EKG 12-Lead   PCV ECHOCARDIOGRAM COMPLETE    There are no Patient Instructions on file for this visit.   --Continue cardiac medications as reconciled in final medication list. --Return in about 6 weeks (around 05/24/2021) for Review test results. Or sooner if needed. --Continue follow-up with your primary care physician regarding the management of your other chronic comorbid conditions.  Patient's questions and concerns were addressed to her satisfaction. She voices understanding of the instructions provided during this encounter.   This note was created using a voice recognition software as  a result there may be grammatical errors inadvertently enclosed that do not reflect the nature of this encounter. Every attempt is made to correct such errors.  Rex Kras, [place], [facility]  Pager: [phone] Office: [phone]

## 2021-04-12 ENCOUNTER — Ambulatory Visit: Payer: Self-pay | Admitting: Cardiology

## 2021-04-12 ENCOUNTER — Encounter: Payer: Self-pay | Admitting: Cardiology

## 2021-04-12 ENCOUNTER — Ambulatory Visit: Payer: Medicare Other | Admitting: Cardiology

## 2021-04-12 ENCOUNTER — Other Ambulatory Visit: Payer: Self-pay

## 2021-04-12 VITALS — BP 147/82 | HR 83 | Resp 16 | Ht 65.0 in | Wt 171.0 lb

## 2021-04-12 DIAGNOSIS — I10 Essential (primary) hypertension: Secondary | ICD-10-CM

## 2021-04-12 DIAGNOSIS — R072 Precordial pain: Secondary | ICD-10-CM

## 2021-04-12 DIAGNOSIS — R9431 Abnormal electrocardiogram [ECG] [EKG]: Secondary | ICD-10-CM

## 2021-04-12 DIAGNOSIS — R55 Syncope and collapse: Secondary | ICD-10-CM

## 2021-04-19 DIAGNOSIS — N183 Chronic kidney disease, stage 3 unspecified: Secondary | ICD-10-CM | POA: Insufficient documentation

## 2021-05-13 ENCOUNTER — Other Ambulatory Visit: Payer: Medicare Other

## 2021-05-24 ENCOUNTER — Ambulatory Visit: Payer: Medicare Other | Admitting: Cardiology

## 2021-06-10 ENCOUNTER — Other Ambulatory Visit: Payer: Self-pay

## 2021-06-10 ENCOUNTER — Ambulatory Visit: Payer: Medicare Other

## 2021-06-10 DIAGNOSIS — I10 Essential (primary) hypertension: Secondary | ICD-10-CM

## 2021-06-10 DIAGNOSIS — R9431 Abnormal electrocardiogram [ECG] [EKG]: Secondary | ICD-10-CM

## 2021-06-16 ENCOUNTER — Ambulatory Visit: Payer: Medicare Other | Admitting: Cardiology

## 2021-06-22 ENCOUNTER — Encounter: Payer: Self-pay | Admitting: Cardiology

## 2021-06-22 ENCOUNTER — Other Ambulatory Visit: Payer: Self-pay

## 2021-06-22 ENCOUNTER — Ambulatory Visit: Payer: Medicare Other | Admitting: Cardiology

## 2021-06-22 VITALS — BP 142/84 | HR 73 | Temp 97.3°F | Resp 16 | Ht 65.0 in | Wt 175.0 lb

## 2021-06-22 DIAGNOSIS — I10 Essential (primary) hypertension: Secondary | ICD-10-CM

## 2021-06-22 DIAGNOSIS — I340 Nonrheumatic mitral (valve) insufficiency: Secondary | ICD-10-CM

## 2021-06-22 DIAGNOSIS — R9439 Abnormal result of other cardiovascular function study: Secondary | ICD-10-CM

## 2021-06-22 DIAGNOSIS — R072 Precordial pain: Secondary | ICD-10-CM

## 2021-06-22 MED ORDER — METOPROLOL TARTRATE 25 MG PO TABS: 25.0000 mg | ORAL_TABLET | Freq: Two times a day (BID) | ORAL | 0 refills | Status: DC

## 2021-06-22 NOTE — Progress Notes (Signed)
Date:  06/22/2021   ID:  [name], DOB 1956/08/17, MRN [id number]  PCP:  Bartholome Bill, MD  Cardiologist:  Rex Kras, DO, [facility]  (established care 04/12/2021)  Date: 06/22/21 Last Office Visit: 04/12/2021  Chief Complaint  Patient presents with   Results   Follow-up    Reevaluation of chest pain    HPI  [name] is a 65 y.o. female who presents to the office with a chief complaint of " reevaluation of chest pain and discuss test results." Patient's past medical history and cardiovascular risk factors include: Former smoker, hypertension.   She is referred to the office at the request of Bartholome Bill, MD for evaluation of syncope and collapse.  Patient was referred to the office for evaluation of syncope and collapse.  However, after initial consultation patient states that this was most likely secondary to over correcting her blood pressures with diuretic therapy.  After her antihypertensive medications were titrated his symptoms have resolved.  However, at the last office visit patient was complaining of symptoms of chest pain that appeared to be noncardiac in origin.  However due to her risk factors and young age that shared decision was to proceed with surface echocardiogram and exercise treadmill stress test.  Results of the echocardiogram and stress test discussed with her in great detail and noted below for further reference.  Clinically patient states that her symptoms of chest pain have essentially resolved.  She has undergone a left heart catheterization in 2003 and was noted to have normal coronaries per EMR.  FUNCTIONAL STATUS: No structured exercise program or daily routine.    ALLERGIES: Allergies  Allergen Reactions   Codeine    Dilantin [Phenytoin Sodium Extended]    Librium [Chlordiazepoxide Hcl]    Tylenol [Acetaminophen] Other (See Comments)    Sensitivity- may take 1-2 doses- but has chest pain after that    MEDICATION LIST  PRIOR TO VISIT: Current Meds  Medication Sig   amLODipine-valsartan (EXFORGE) 5-160 MG tablet Take 1 tablet by mouth daily.   carbamazepine (TEGRETOL XR) 200 MG 12 hr tablet Take 200 mg by mouth 2 (two) times daily.    Cholecalciferol (VITAMIN D-3) 25 MCG (1000 UT) CAPS Take 1 capsule by mouth daily at 12 noon.   ferrous sulfate 325 (65 FE) MG EC tablet Take 325 mg by mouth 2 (two) times daily.   fluocinonide (LIDEX) 0.05 % external solution Apply 1 application topically as needed.   Magnesium 400 MG CAPS Take 1 capsule by mouth at bedtime.   metoprolol tartrate (LOPRESSOR) 25 MG tablet Take 1 tablet (25 mg total) by mouth 2 (two) times daily for 14 days.   Multiple Vitamin (MULTIVITAMIN PO) Take 1 tablet by mouth daily at 12 noon. Alive Women   Multiple Vitamins-Minerals (HAIR/SKIN/NAILS/BIOTIN) TABS Take 1 tablet by mouth daily at 12 noon.   tiZANidine (ZANAFLEX) 2 MG tablet Take 1 tablet by mouth as needed.     PAST MEDICAL HISTORY: Past Medical History:  Diagnosis Date   Connective tissue disorder ([place])    Hypertension    Seizures ([place])     PAST SURGICAL HISTORY: Past Surgical History:  Procedure Laterality Date   CHOLECYSTECTOMY     HERNIA REPAIR     left foot surgery     TUBAL LIGATION      FAMILY HISTORY: The patient family history includes Cancer in her brother, father, maternal grandmother, and mother; Hypertension in her mother.  SOCIAL HISTORY:  The patient  reports that she has never smoked. She has never used smokeless tobacco. She reports that she does not drink alcohol and does not use drugs.  REVIEW OF SYSTEMS: Review of Systems  Constitutional: Positive for malaise/fatigue (improved.). Negative for chills and fever.  HENT:  Negative for hoarse voice and nosebleeds.   Eyes:  Negative for discharge, double vision and pain.  Cardiovascular:  Negative for chest pain, claudication, dyspnea on exertion, leg swelling, near-syncope, orthopnea, palpitations,  paroxysmal nocturnal dyspnea and syncope.  Respiratory:  Negative for hemoptysis and shortness of breath.   Musculoskeletal:  Negative for muscle cramps and myalgias.  Gastrointestinal:  Negative for abdominal pain, constipation, diarrhea, hematemesis, hematochezia, melena, nausea and vomiting.  Neurological:  Negative for dizziness and light-headedness.   PHYSICAL EXAM: Vitals with BMI 06/22/2021 06/22/2021 04/12/2021  Height - _0  _1   Weight - 175 lbs 171 lbs  BMI - 58.52 77.82  Systolic 423 536 144  Diastolic 84 92 82  Pulse 73 64 83   CONSTITUTIONAL: Well-developed and well-nourished. No acute distress.  SKIN: Skin is warm and dry. No rash noted. No cyanosis. No pallor. No jaundice HEAD: Normocephalic and atraumatic.  EYES: No scleral icterus MOUTH/THROAT: Moist oral membranes.  NECK: No JVD present. No thyromegaly noted. No carotid bruits  LYMPHATIC: No visible cervical adenopathy.  CHEST Normal respiratory effort. No intercostal retractions  LUNGS: Clear to auscultation bilaterally. No stridor. No wheezes. No rales.  CARDIOVASCULAR: Regular rate and rhythm, positive S1-S2, no murmurs rubs or gallops appreciated. ABDOMINAL: Soft, nontender, nondistended, positive bowel sounds all 4 quadrants. No apparent ascites.  EXTREMITIES: No peripheral edema, warm to touch. HEMATOLOGIC: No significant bruising NEUROLOGIC: Oriented to person, place, and time. Nonfocal. Normal muscle tone.  PSYCHIATRIC: Normal mood and affect. Normal behavior. Cooperative  CARDIAC DATABASE: EKG: 04/12/2021: Normal sinus rhythm, 68 bpm, normal axis, nonspecific T wave abnormality, without underlying injury pattern.   Echocardiogram: 06/10/2021: Normal LV systolic function with visual EF 55-60%. Left ventricle cavity is normal in size. MIld to moderate left ventricular hypertrophy. Normal global wall motion. Indeterminate diastolic filling pattern, elevated LAP. Mild (Grade I) aortic regurgitation. Mild  (Grade I) mitral regurgitation. Mild tricuspid regurgitation. No evidence of pulmonary hypertension. RVSP measures 31 mmHg. IVC is normal with a respiratory response of <50%. No prior study for comparison.   Stress Testing: Exercise treadmill stress test 06/10/2021: Functional status: Poor.  Chest pain: No.  Reason for stopping exercise: Fatigue/weakness.  Hypertensive response to exercise: No.  Exercise time 4 minutes 26 seconds on Bruce protocol, achieved 6.36 METS, 86% APMHR.  Stress ECG positive for ischemia.   Abnormal GXT. Consider further cardiac work up if clinically indicated.  Heart Catheterization: 11/19 2003 by Dr. Daneen Schick III at [facility]:  1. Essentially normal coronary arteries.  2. Normal left ventricular function.  LABORATORY DATA: No flowsheet data found.  No flowsheet data found.  Lipid Panel  No results found for: CHOL, TRIG, HDL, CHOLHDL, VLDL, LDLCALC, LDLDIRECT, LABVLDL  No components found for: NTPROBNP No results for input(s): PROBNP in the last 8760 hours. No results for input(s): TSH in the last 8760 hours.  BMP No results for input(s): NA, K, CL, CO2, GLUCOSE, BUN, CREATININE, CALCIUM, GFRNONAA, GFRAA in the last 8760 hours.  HEMOGLOBIN A1C No results found for: HGBA1C, MPG  External Labs: Collected: 03/30/2021 available in Care Everywhere Hemoglobin 10.1 g/dL, hematocrit 20.7% Magnesium 1.5 BNP 39 TSH 1.88. Sodium 126, potassium 3.9, chloride 95, bicarb 24,  BUN 26 Creatinine 1.36 mg/dL. eGFR: 43 mL/min per 1.73 m AST 19, ALT 16 (normal), alkaline phosphatase 134 (elevated)  06/24/2020: Lipid profile: Total cholesterol 205, triglycerides 84, HDL 62, LDL 126, non-HDL 143.  IMPRESSION:    ICD-10-CM   1. Precordial pain  O14.1 Basic metabolic panel    CT CORONARY MORPH W/CTA COR W/SCORE W/CA W/CM &/OR WO/CM    metoprolol tartrate (LOPRESSOR) 25 MG tablet    2. Abnormal stress electrocardiogram test using treadmill   R94.39     3. Benign hypertension  I10     4. Mild mitral regurgitation  I34.0        RECOMMENDATIONS: [name] is a 65 y.o. female whose past medical history and cardiac risk factors include: Former smoker, hypertension.   Precordial pain Currently chest pain-free. History of noncardiac discomfort. Underwent exercise treadmill stress test and was noted to have ischemia at peak stress; therefore, additional cardiovascular testing recommended. Patient will be scheduled for coronary CTA plus or minus CT FFR. Check BMP Start Lopressor 25 mg p.o. twice daily a week prior to the coronary CTA.  Abnormal stress electrocardiogram test using treadmill See above  Benign hypertension Office blood pressures are within acceptable range. Educated on importance of low-salt diet. Currently managed by primary care provider.  Mild mitral regurgitation Most recent echocardiogram noted preserved LVEF, indeterminate diastolic filling pattern, and mild valvular heart disease. Clinically patient is asymptomatic and would not recommend any additional testing with regards to her valvular disease.   Would recommend follow-up echo in 3 to 5 years or sooner if change in clinical status.  Patient is agreeable with the plan of care   FINAL MEDICATION LIST END OF ENCOUNTER: Meds ordered this encounter  Medications   metoprolol tartrate (LOPRESSOR) 25 MG tablet    Sig: Take 1 tablet (25 mg total) by mouth 2 (two) times daily for 14 days.    Dispense:  28 tablet    Refill:  0     Medications Discontinued During This Encounter  Medication Reason   triamterene-hydrochlorothiazide (MAXZIDE-25) 37.5-25 MG per tablet Error     Current Outpatient Medications:    amLODipine-valsartan (EXFORGE) 5-160 MG tablet, Take 1 tablet by mouth daily., Disp: , Rfl:    carbamazepine (TEGRETOL XR) 200 MG 12 hr tablet, Take 200 mg by mouth 2 (two) times daily. , Disp: , Rfl:    Cholecalciferol (VITAMIN D-3) 25 MCG  (1000 UT) CAPS, Take 1 capsule by mouth daily at 12 noon., Disp: , Rfl:    ferrous sulfate 325 (65 FE) MG EC tablet, Take 325 mg by mouth 2 (two) times daily., Disp: , Rfl:    fluocinonide (LIDEX) 0.05 % external solution, Apply 1 application topically as needed., Disp: , Rfl:    Magnesium 400 MG CAPS, Take 1 capsule by mouth at bedtime., Disp: , Rfl:    metoprolol tartrate (LOPRESSOR) 25 MG tablet, Take 1 tablet (25 mg total) by mouth 2 (two) times daily for 14 days., Disp: 28 tablet, Rfl: 0   Multiple Vitamin (MULTIVITAMIN PO), Take 1 tablet by mouth daily at 12 noon. Alive Women, Disp: , Rfl:    Multiple Vitamins-Minerals (HAIR/SKIN/NAILS/BIOTIN) TABS, Take 1 tablet by mouth daily at 12 noon., Disp: , Rfl:    tiZANidine (ZANAFLEX) 2 MG tablet, Take 1 tablet by mouth as needed., Disp: , Rfl:   Orders Placed This Encounter  Procedures   CT CORONARY MORPH W/CTA COR W/SCORE W/CA W/CM &/OR WO/CM   Basic metabolic  panel   There are no Patient Instructions on file for this visit.   --Continue cardiac medications as reconciled in final medication list. --Return in about 4 weeks (around 07/20/2021) for Review CCTA . Or sooner if needed. --Continue follow-up with your primary care physician regarding the management of your other chronic comorbid conditions.  Patient's questions and concerns were addressed to her satisfaction. She voices understanding of the instructions provided during this encounter.   This note was created using a voice recognition software as a result there may be grammatical errors inadvertently enclosed that do not reflect the nature of this encounter. Every attempt is made to correct such errors.  Total time spent: 33 minutes.  Rex Kras, [place], [facility]  Pager: [phone] Office: [phone]

## 2021-06-23 LAB — BASIC METABOLIC PANEL
BUN/Creatinine Ratio: 14 (ref 12–28)
BUN: 16 mg/dL (ref 8–27)
CO2: 22 mmol/L (ref 20–29)
Calcium: 9.2 mg/dL (ref 8.7–10.3)
Chloride: 104 mmol/L (ref 96–106)
Creatinine, Ser: 1.16 mg/dL — ABNORMAL HIGH (ref 0.57–1.00)
Glucose: 78 mg/dL (ref 70–99)
Potassium: 3.9 mmol/L (ref 3.5–5.2)
Sodium: 141 mmol/L (ref 134–144)
eGFR: 52 mL/min/{1.73_m2} — ABNORMAL LOW (ref >59)

## 2021-06-28 NOTE — Progress Notes (Signed)
Patient came in for an appt

## 2021-07-05 ENCOUNTER — Other Ambulatory Visit: Payer: Self-pay

## 2021-07-05 ENCOUNTER — Ambulatory Visit (HOSPITAL_COMMUNITY)
Admission: RE | Admit: 2021-07-05 | Discharge: 2021-07-05 | Disposition: A | Discharge status: Home / Self Care | Payer: Medicare Other | Source: Ambulatory Visit | Attending: Cardiology | Admitting: Cardiology

## 2021-07-05 ENCOUNTER — Encounter (HOSPITAL_COMMUNITY): Payer: Self-pay

## 2021-07-05 DIAGNOSIS — R072 Precordial pain: Secondary | ICD-10-CM | POA: Insufficient documentation

## 2021-07-05 MED ORDER — NITROGLYCERIN 0.4 MG SL SUBL: 0.8000 mg | SUBLINGUAL_TABLET | Freq: Once | SUBLINGUAL | Status: DC

## 2021-07-06 ENCOUNTER — Ambulatory Visit (HOSPITAL_COMMUNITY)
Admission: RE | Admit: 2021-07-06 | Discharge: 2021-07-06 | Disposition: A | Discharge status: Home / Self Care | Payer: Medicare Other | Source: Ambulatory Visit

## 2021-07-06 NOTE — Progress Notes (Signed)
Late entry. Patient was seen for CT heart scan. Vitals were taken and multiple attempts by nursing staff were made to start IV and were unsuccessful. CT staff came with the ultrasound and also attempted IV start, IV team came and attempted IV start.  Scan canceled due to inability to obtain IV access for contrast. Both of the CT heart Navigators were notified. Nothing further needed.

## 2021-07-21 ENCOUNTER — Other Ambulatory Visit: Payer: Self-pay | Admitting: Cardiology

## 2021-07-21 ENCOUNTER — Ambulatory Visit: Payer: Medicare Other | Admitting: Cardiology

## 2021-07-21 DIAGNOSIS — R072 Precordial pain: Secondary | ICD-10-CM

## 2021-07-27 ENCOUNTER — Other Ambulatory Visit: Payer: Self-pay

## 2021-07-27 ENCOUNTER — Ambulatory Visit: Payer: Medicare Other

## 2021-07-27 DIAGNOSIS — R072 Precordial pain: Secondary | ICD-10-CM

## 2021-07-27 LAB — PCV MYOCARDIAL PERFUSION WO LEXISCAN
Base ST Depression (mm): 0 mm
ST Depression (mm): 0 mm

## 2021-07-28 NOTE — Progress Notes (Signed)
Spoke to patient she is aware

## 2021-08-08 ENCOUNTER — Ambulatory Visit: Payer: Medicare Other | Admitting: Cardiology

## 2021-08-08 ENCOUNTER — Other Ambulatory Visit: Payer: Self-pay

## 2021-08-08 ENCOUNTER — Encounter: Payer: Self-pay | Admitting: Cardiology

## 2021-08-08 ENCOUNTER — Telehealth: Payer: Self-pay | Admitting: Pharmacist

## 2021-08-08 VITALS — BP 159/86 | HR 67 | Resp 16 | Ht 65.0 in | Wt 177.6 lb

## 2021-08-08 DIAGNOSIS — I10 Essential (primary) hypertension: Secondary | ICD-10-CM

## 2021-08-08 DIAGNOSIS — I340 Nonrheumatic mitral (valve) insufficiency: Secondary | ICD-10-CM

## 2021-08-08 DIAGNOSIS — R072 Precordial pain: Secondary | ICD-10-CM

## 2021-08-08 MED ORDER — VALSARTAN 320 MG PO TABS: 320.0000 mg | ORAL_TABLET | Freq: Every morning | ORAL | 0 refills | Status: DC

## 2021-08-08 MED ORDER — AMLODIPINE BESYLATE 10 MG PO TABS: 10.0000 mg | ORAL_TABLET | Freq: Every day | ORAL | 0 refills | Status: DC

## 2021-08-08 NOTE — Telephone Encounter (Signed)
CARE PLAN ENTRY  08/08/2021 Name: [name] MRN: [id number] DOB: 1956-01-28  [name] is enrolled in Remote Patient Monitoring/Principle Care Monitoring.  Date of Enrollment: 08/08/21 Supervising physician: Tessa Lerner Indication: HTN  Remote Readings:  New Start  Next scheduled OV: ~10/2021  Pharmacist Clinical Goal(s):  Over the next 90 days, patient will demonstrate Improved medication adherence as evidenced by medication fill history Over the next 90 days, patient will demonstrate improved understanding of prescribed medications and rationale for usage as evidenced by patient teach back Over the next 90 days, patient will experience decrease in ED visits. ED visits in last 6 months = 0 Over the next 90 days, patient will not experience hospital admission. Hospital Admissions in last 6 months = 0  Interventions: Provider and Inter-disciplinary care team collaboration (see longitudinal plan of care) Comprehensive medication review performed. Discussed plans with patient for ongoing care management follow up and provided patient with direct contact information for care management team Collaboration with provider re: medication management  Patient Self Care Activities:  Self administers medications as prescribed Attends all scheduled provider appointments Performs ADL's independently Performs IADL's independently  Allergies  Allergen Reactions   Codeine    Dilantin [Phenytoin Sodium Extended]    Librium [Chlordiazepoxide Hcl]    Tylenol [Acetaminophen] Other (See Comments)    Sensitivity- may take 1-2 doses- but has chest pain after that   Outpatient Encounter Medications as of 08/08/2021  Medication Sig Note   amLODipine (NORVASC) 10 MG tablet Take 1 tablet (10 mg total) by mouth daily at 10 pm.    carbamazepine (TEGRETOL XR) 200 MG 12 hr tablet Take 200 mg by mouth 2 (two) times daily.  06/22/2016: Patient has changed dosing to 200mg  bid for now and can change  back if needed   Cholecalciferol (VITAMIN D-3) 25 MCG (1000 UT) CAPS Take 1 capsule by mouth daily at 12 noon.    ferrous sulfate 325 (65 FE) MG EC tablet Take 325 mg by mouth 2 (two) times daily.    fluocinonide (LIDEX) 0.05 % external solution Apply 1 application topically as needed.    Magnesium 400 MG CAPS Take 1 capsule by mouth at bedtime.    Multiple Vitamin (MULTIVITAMIN PO) Take 1 tablet by mouth daily at 12 noon. Alive Women    Multiple Vitamins-Minerals (HAIR/SKIN/NAILS/BIOTIN) TABS Take 1 tablet by mouth daily at 12 noon.    tiZANidine (ZANAFLEX) 2 MG tablet Take 1 tablet by mouth as needed.    valsartan (DIOVAN) 320 MG tablet Take 1 tablet (320 mg total) by mouth every morning.    No facility-administered encounter medications on file as of 08/08/2021.    Hypertension   BP goal is:  <130/80  Office blood pressures are  BP Readings from Last 3 Encounters:  08/08/21 (!) 159/86  07/05/21 (!) 175/90  06/22/21 (!) 142/84    Patient is currently uncontrolled on the following medications: Amlodipine 10 mg, valsartan 320 mg  Patient checks BP at home twice daily  Patient has tried  these meds in the past: Metoprolol tartrate (fatigue, weakness), amlodipine-valsartan, triamterene-HCTZ  We discussed diet and exercise extensively  Plan  Continue current medications and control with diet and exercise   Reviewed with pt regarding reducing her salt intake and increasing her physical activity level to ~150 mins/week. Pt reports that she does have mild episodic lower extremity swelling from standing on her feet all day. Improves with leg elevation. Pt aware to monitor for amlodipine ADR  of possible increase lwoer extremity edema and notify the office if symptoms conitnues to persist or worsen. Pt notes that she sometimes also has some lower extremity pain and itching sensation at baseline. Pt to monitor for now and review with PCP if symptoms persists or worsen. Reviewed healthy plan  model and stress reduction recommendations together. Pt aware to get repeat labs in 1-2 weeks since starting new dose valsartan.   ______________ Visit Information SDOH (Social Determinants of Health) assessments performed: Yes.  [name] was given information about Principle Care Management/Remote Patient Monitoring services today including:  RPM/PCM service includes personalized support from designated clinical staff supervised by her physician, including individualized plan of care and coordination with other care providers 24/7 contact phone numbers for assistance for urgent and routine care needs. Standard insurance, coinsurance, copays and deductibles apply for principle care management only during months in which we provide at least 30 minutes of these services. Most insurances cover these services at 100%, however patients may be responsible for any copay, coinsurance and/or deductible if applicable. This service may help you avoid the need for more expensive face-to-face services. Only one practitioner may furnish and bill the service in a calendar month. The patient may stop PCM/RPM services at any time (effective at the end of the month) by phone call to the office staff.  Patient agreed to services and verbal consent obtained.   Cassell Clement, Pharm.D. Clinical Pharmacist [facility] [phone] Ext: [phone]

## 2021-08-08 NOTE — Progress Notes (Signed)
Date:  08/08/2021   ID:  [name], DOB 20-Nov-1955, MRN [id number]  PCP:  Bartholome Bill, MD  Cardiologist:  Rex Kras, DO, [facility]  (established care 04/12/2021)  Date: 08/08/21 Last Office Visit: 04/12/2021  Chief Complaint  Patient presents with   Results   Follow-up   Hypertension    HPI  [name] is a 65 y.o. female who presents to the office with a chief complaint of " discuss test results and blood pressure management." Patient's past medical history and cardiovascular risk factors include: Former smoker, hypertension.   She is referred to the office at the request of Bartholome Bill, MD for evaluation of syncope and collapse.  In the past patient had an episode of near syncope/syncope which was attributed to overcorrection of her blood pressures and since medication titration the symptoms have resolved.  During prior office visit patient was complaining of symptoms of chest pain which appeared to be noncardiac based on symptoms however given her risk factors patient underwent echocardiogram and GXT.  GXT was reported to be abnormal at the last office visit the shared decision was to proceed with coronary CTA +/- FFR.  However due to IV access issues coronary CTA was not carried through.  Patient underwent nuclear stress test which noted hypertensive response to exercise but overall low risk study.  Patient denies any chest pain since last office encounter.  Blood pressures are elevated at home and complains of feeling tired, fatigued, morning headaches.  FUNCTIONAL STATUS: No structured exercise program or daily routine.    ALLERGIES: Allergies  Allergen Reactions   Codeine    Dilantin [Phenytoin Sodium Extended]    Librium [Chlordiazepoxide Hcl]    Tylenol [Acetaminophen] Other (See Comments)    Sensitivity- may take 1-2 doses- but has chest pain after that    MEDICATION LIST PRIOR TO VISIT: Current Meds  Medication Sig   amLODipine (NORVASC)  10 MG tablet Take 1 tablet (10 mg total) by mouth daily at 10 pm.   carbamazepine (TEGRETOL XR) 200 MG 12 hr tablet Take 200 mg by mouth 2 (two) times daily.    Cholecalciferol (VITAMIN D-3) 25 MCG (1000 UT) CAPS Take 1 capsule by mouth daily at 12 noon.   ferrous sulfate 325 (65 FE) MG EC tablet Take 325 mg by mouth 2 (two) times daily.   fluocinonide (LIDEX) 0.05 % external solution Apply 1 application topically as needed.   Magnesium 400 MG CAPS Take 1 capsule by mouth at bedtime.   Multiple Vitamin (MULTIVITAMIN PO) Take 1 tablet by mouth daily at 12 noon. Alive Women   Multiple Vitamins-Minerals (HAIR/SKIN/NAILS/BIOTIN) TABS Take 1 tablet by mouth daily at 12 noon.   tiZANidine (ZANAFLEX) 2 MG tablet Take 1 tablet by mouth as needed.   valsartan (DIOVAN) 320 MG tablet Take 1 tablet (320 mg total) by mouth every morning.   [DISCONTINUED] amLODipine-valsartan (EXFORGE) 5-160 MG tablet Take 1 tablet by mouth daily.     PAST MEDICAL HISTORY: Past Medical History:  Diagnosis Date   Connective tissue disorder ([place])    Hypertension    Seizures ([place])     PAST SURGICAL HISTORY: Past Surgical History:  Procedure Laterality Date   CHOLECYSTECTOMY     HERNIA REPAIR     left foot surgery     TUBAL LIGATION      FAMILY HISTORY: The patient family history includes Cancer in her brother, father, maternal grandmother, and mother; Hypertension in her mother.  SOCIAL  HISTORY:  The patient  reports that she has never smoked. She has never used smokeless tobacco. She reports that she does not drink alcohol and does not use drugs.  REVIEW OF SYSTEMS: Review of Systems  Constitutional: Positive for malaise/fatigue (improved.). Negative for chills and fever.  HENT:  Negative for hoarse voice and nosebleeds.   Eyes:  Negative for discharge, double vision and pain.  Cardiovascular:  Negative for chest pain, claudication, dyspnea on exertion, leg swelling, near-syncope, orthopnea, palpitations,  paroxysmal nocturnal dyspnea and syncope.  Respiratory:  Negative for hemoptysis and shortness of breath.   Musculoskeletal:  Negative for muscle cramps and myalgias.  Gastrointestinal:  Negative for abdominal pain, constipation, diarrhea, hematemesis, hematochezia, melena, nausea and vomiting.  Neurological:  Negative for dizziness and light-headedness.   PHYSICAL EXAM: Vitals with BMI 08/08/2021 07/05/2021 06/22/2021  Height _0  - -  Weight 177 lbs 10 oz - -  BMI 17.61 - -  Systolic 607 371 062  Diastolic 86 90 84  Pulse 67 65 73   CONSTITUTIONAL: Well-developed and well-nourished. No acute distress.  SKIN: Skin is warm and dry. No rash noted. No cyanosis. No pallor. No jaundice HEAD: Normocephalic and atraumatic.  EYES: No scleral icterus MOUTH/THROAT: Moist oral membranes.  NECK: No JVD present. No thyromegaly noted. No carotid bruits  LYMPHATIC: No visible cervical adenopathy.  CHEST Normal respiratory effort. No intercostal retractions  LUNGS: Clear to auscultation bilaterally. No stridor. No wheezes. No rales.  CARDIOVASCULAR: Regular rate and rhythm, positive S1-S2, no murmurs rubs or gallops appreciated. ABDOMINAL: Soft, nontender, nondistended, positive bowel sounds all 4 quadrants. No apparent ascites.  EXTREMITIES: No peripheral edema, warm to touch. HEMATOLOGIC: No significant bruising NEUROLOGIC: Oriented to person, place, and time. Nonfocal. Normal muscle tone.  PSYCHIATRIC: Normal mood and affect. Normal behavior. Cooperative  CARDIAC DATABASE: EKG: 04/12/2021: Normal sinus rhythm, 68 bpm, normal axis, nonspecific T wave abnormality, without underlying injury pattern.   Echocardiogram: 06/10/2021: Normal LV systolic function with visual EF 55-60%. Left ventricle cavity is normal in size. MIld to moderate left ventricular hypertrophy. Normal global wall motion. Indeterminate diastolic filling pattern, elevated LAP. Mild (Grade I) aortic regurgitation. Mild (Grade  I) mitral regurgitation. Mild tricuspid regurgitation. No evidence of pulmonary hypertension. RVSP measures 31 mmHg. IVC is normal with a respiratory response of <50%. No prior study for comparison.   Stress Testing: Exercise treadmill stress test 06/10/2021: Functional status: Poor.  Chest pain: No.  Reason for stopping exercise: Fatigue/weakness.  Hypertensive response to exercise: No.  Exercise time 4 minutes 26 seconds on Bruce protocol, achieved 6.36 METS, 86% APMHR.  Stress ECG positive for ischemia.   Abnormal GXT. Consider further cardiac work up if clinically indicated.  Exercise nuclear stress test 07/26/2021:  Equivocal ECG stress. The patient exercised for 4 minutes and 30 seconds of a Bruce protocol, achieving approximately 6.42 METs. Hypertensive blood pressure response, peak blood pressure 220/100 mmHg. Resting EKG normal sinus rhythm, nonspecific ST-T abnormality. Peak EKG 2 mm downsloping ST depression in inferolateral leads. ST changes back to baseline at 2 minutes into recovery. No chest pain, Dyspnea present.  Myocardial perfusion is normal.  Overall LV systolic function is normal without regional wall motion abnormalities. Stress LV EF: 46%. but visually appears normal.  No previous exam available for comparison. Low risk study.   Heart Catheterization: 11/19 2003 by Dr. Daneen Schick III at [facility]:  1. Essentially normal coronary arteries.  2. Normal left ventricular function.  LABORATORY DATA: No flowsheet  data found.  CMP Latest Ref Rng & Units 06/22/2021  Glucose 70 - 99 mg/dL 78  BUN 8 - 27 mg/dL 16  Creatinine 0.57 - 1.00 mg/dL 1.16(H)  Sodium 134 - 144 mmol/L 141  Potassium 3.5 - 5.2 mmol/L 3.9  Chloride 96 - 106 mmol/L 104  CO2 20 - 29 mmol/L 22  Calcium 8.7 - 10.3 mg/dL 9.2    Lipid Panel  No results found for: CHOL, TRIG, HDL, CHOLHDL, VLDL, LDLCALC, LDLDIRECT, LABVLDL  No components found for: NTPROBNP No results for input(s):  PROBNP in the last 8760 hours. No results for input(s): TSH in the last 8760 hours.  BMP Recent Labs    06/22/21 1331  NA 141  K 3.9  CL 104  CO2 22  GLUCOSE 78  BUN 16  CREATININE 1.16*  CALCIUM 9.2    HEMOGLOBIN A1C No results found for: HGBA1C, MPG  External Labs: Collected: 03/30/2021 available in Care Everywhere Hemoglobin 10.1 g/dL, hematocrit 20.7% Magnesium 1.5 BNP 39 TSH 1.88. Sodium 126, potassium 3.9, chloride 95, bicarb 24, BUN 26 Creatinine 1.36 mg/dL. eGFR: 43 mL/min per 1.73 m AST 19, ALT 16 (normal), alkaline phosphatase 134 (elevated)  06/24/2020: Lipid profile: Total cholesterol 205, triglycerides 84, HDL 62, LDL 126, non-HDL 143.  IMPRESSION:    ICD-10-CM   1. Precordial pain  R07.2     2. Benign hypertension  I10 amLODipine (NORVASC) 10 MG tablet    valsartan (DIOVAN) 320 MG tablet    Basic metabolic panel    Magnesium    Ambulatory referral to Sleep Studies    3. Mild mitral regurgitation  I34.0        RECOMMENDATIONS: KIRBY ARGUETA is a 65 y.o. female whose past medical history and cardiac risk factors include: Former smoker, hypertension.   Precordial pain Chest pain-free. Nuclear stress test low risk study. Educated on improving her modifiable cardiovascular risk factors. Monitor for now.  Benign hypertension Patient is requesting assistance with blood pressure management until she has a chance to follow-up with her PCP.  Patient states that her PCP appointment is not scheduled until July 2023. Noted to have hypertensive response with exercise during her current stress test Home blood pressures are not well controlled. Discontinue amlodipine/valsartan. Increase amlodipine to 10 mg p.o. every afternoon Increase valsartan to 320 mg p.o. every morning Educated on the importance of low-salt diet.  Reemphasized the importance of complete cessation of canned tuna fish as well. Will enroll into principal care management for  longitudinal follow-up for blood pressure. Blood work in 1 week to evaluate kidney function and electrolytes. Recommend outpatient sleep study evaluation-will refer to Dr. Nehemiah Settle.  Mild mitral regurgitation Most recent echocardiogram noted preserved LVEF, indeterminate diastolic filling pattern, and mild valvular heart disease. Clinically patient is asymptomatic and would not recommend any additional testing with regards to her valvular disease.   Would recommend follow-up echo in 3 to 5 years or sooner if change in clinical status.  Patient is agreeable with the plan of care   FINAL MEDICATION LIST END OF ENCOUNTER: Meds ordered this encounter  Medications   amLODipine (NORVASC) 10 MG tablet    Sig: Take 1 tablet (10 mg total) by mouth daily at 10 pm.    Dispense:  90 tablet    Refill:  0   valsartan (DIOVAN) 320 MG tablet    Sig: Take 1 tablet (320 mg total) by mouth every morning.    Dispense:  90 tablet  Refill:  0     Medications Discontinued During This Encounter  Medication Reason   metoprolol tartrate (LOPRESSOR) 25 MG tablet    amLODipine-valsartan (EXFORGE) 5-160 MG tablet Change in therapy     Current Outpatient Medications:    amLODipine (NORVASC) 10 MG tablet, Take 1 tablet (10 mg total) by mouth daily at 10 pm., Disp: 90 tablet, Rfl: 0   carbamazepine (TEGRETOL XR) 200 MG 12 hr tablet, Take 200 mg by mouth 2 (two) times daily. , Disp: , Rfl:    Cholecalciferol (VITAMIN D-3) 25 MCG (1000 UT) CAPS, Take 1 capsule by mouth daily at 12 noon., Disp: , Rfl:    ferrous sulfate 325 (65 FE) MG EC tablet, Take 325 mg by mouth 2 (two) times daily., Disp: , Rfl:    fluocinonide (LIDEX) 0.05 % external solution, Apply 1 application topically as needed., Disp: , Rfl:    Magnesium 400 MG CAPS, Take 1 capsule by mouth at bedtime., Disp: , Rfl:    Multiple Vitamin (MULTIVITAMIN PO), Take 1 tablet by mouth daily at 12 noon. Alive Women, Disp: , Rfl:    Multiple  Vitamins-Minerals (HAIR/SKIN/NAILS/BIOTIN) TABS, Take 1 tablet by mouth daily at 12 noon., Disp: , Rfl:    tiZANidine (ZANAFLEX) 2 MG tablet, Take 1 tablet by mouth as needed., Disp: , Rfl:    valsartan (DIOVAN) 320 MG tablet, Take 1 tablet (320 mg total) by mouth every morning., Disp: 90 tablet, Rfl: 0  Orders Placed This Encounter  Procedures   Basic metabolic panel   Magnesium   Ambulatory referral to Sleep Studies   There are no Patient Instructions on file for this visit.   --Continue cardiac medications as reconciled in final medication list. --Return in about 3 months (around 11/08/2021) for Follow up, BP. Or sooner if needed. --Continue follow-up with your primary care physician regarding the management of your other chronic comorbid conditions.  Patient's questions and concerns were addressed to her satisfaction. She voices understanding of the instructions provided during this encounter.   This note was created using a voice recognition software as a result there may be grammatical errors inadvertently enclosed that do not reflect the nature of this encounter. Every attempt is made to correct such errors.  Total time spent: 31 minutes.  Rex Kras, [place], [facility]  Pager: [phone] Office: [phone]

## 2021-08-16 ENCOUNTER — Other Ambulatory Visit: Payer: Self-pay

## 2021-08-16 DIAGNOSIS — I10 Essential (primary) hypertension: Secondary | ICD-10-CM

## 2021-08-23 ENCOUNTER — Encounter: Payer: Self-pay | Admitting: Pharmacist

## 2021-08-23 DIAGNOSIS — M2550 Pain in unspecified joint: Secondary | ICD-10-CM | POA: Insufficient documentation

## 2021-08-23 DIAGNOSIS — M1990 Unspecified osteoarthritis, unspecified site: Secondary | ICD-10-CM | POA: Insufficient documentation

## 2021-08-24 LAB — BASIC METABOLIC PANEL
BUN/Creatinine Ratio: 12 (ref 12–28)
BUN: 15 mg/dL (ref 8–27)
CO2: 24 mmol/L (ref 20–29)
Calcium: 9.3 mg/dL (ref 8.7–10.3)
Chloride: 102 mmol/L (ref 96–106)
Creatinine, Ser: 1.25 mg/dL — ABNORMAL HIGH (ref 0.57–1.00)
Glucose: 85 mg/dL (ref 70–99)
Potassium: 4.1 mmol/L (ref 3.5–5.2)
Sodium: 138 mmol/L (ref 134–144)
eGFR: 48 mL/min/{1.73_m2} — ABNORMAL LOW (ref >59)

## 2021-08-24 LAB — MAGNESIUM: Magnesium: 1.7 mg/dL (ref 1.6–2.3)

## 2021-08-26 NOTE — Progress Notes (Signed)
No answer left a vm to call back

## 2021-08-30 NOTE — Progress Notes (Signed)
Called and spoke to pt, pt voiced understanding.

## 2021-09-16 ENCOUNTER — Telehealth: Payer: Self-pay | Admitting: Pharmacist

## 2021-09-16 DIAGNOSIS — I10 Essential (primary) hypertension: Secondary | ICD-10-CM

## 2021-09-16 MED ORDER — SPIRONOLACTONE 25 MG PO TABS: 25.0000 mg | ORAL_TABLET | Freq: Every day | ORAL | 1 refills | Status: DC

## 2021-09-16 NOTE — Telephone Encounter (Signed)
Agree w/ Aldactone.   Follow up labs in one week.   Continue to monitor ambulatory blood pressure readings.  Tessa Lerner, [place], [facility]  Pager: [phone] Office: [phone]

## 2021-09-28 LAB — BASIC METABOLIC PANEL
BUN/Creatinine Ratio: 13 (ref 12–28)
BUN: 16 mg/dL (ref 8–27)
CO2: 23 mmol/L (ref 20–29)
Calcium: 9.4 mg/dL (ref 8.7–10.3)
Chloride: 98 mmol/L (ref 96–106)
Creatinine, Ser: 1.21 mg/dL — ABNORMAL HIGH (ref 0.57–1.00)
Glucose: 93 mg/dL (ref 70–99)
Potassium: 4 mmol/L (ref 3.5–5.2)
Sodium: 135 mmol/L (ref 134–144)
eGFR: 49 mL/min/{1.73_m2} — ABNORMAL LOW (ref >59)

## 2021-10-05 MED ORDER — CHLORTHALIDONE 25 MG PO TABS: 25.0000 mg | ORAL_TABLET | Freq: Every morning | ORAL | 0 refills | Status: DC

## 2021-10-05 NOTE — Telephone Encounter (Signed)
I added chlorthalidone with repeat labs in one week. Please follow up.   Dr.Ily Denno

## 2021-10-05 NOTE — Addendum Note (Signed)
Addended by: Durward Mallard on: 10/05/2021 01:35 PM   Modules accepted: Orders

## 2021-10-10 NOTE — Addendum Note (Signed)
Addended by: Cassell Clement T on: 10/10/2021 12:32 PM   Modules accepted: Orders

## 2021-10-11 ENCOUNTER — Other Ambulatory Visit: Payer: Self-pay | Admitting: Cardiology

## 2021-10-17 LAB — BASIC METABOLIC PANEL
BUN/Creatinine Ratio: 11 — ABNORMAL LOW (ref 12–28)
BUN: 14 mg/dL (ref 8–27)
CO2: 16 mmol/L — ABNORMAL LOW (ref 20–29)
Calcium: 9.1 mg/dL (ref 8.7–10.3)
Chloride: 102 mmol/L (ref 96–106)
Creatinine, Ser: 1.22 mg/dL — ABNORMAL HIGH (ref 0.57–1.00)
Glucose: 95 mg/dL (ref 70–99)
Sodium: 137 mmol/L (ref 134–144)
eGFR: 49 mL/min/{1.73_m2} — ABNORMAL LOW (ref >59)

## 2021-10-18 ENCOUNTER — Other Ambulatory Visit: Payer: Self-pay | Admitting: Pharmacist

## 2021-10-18 DIAGNOSIS — I10 Essential (primary) hypertension: Secondary | ICD-10-CM

## 2021-10-18 NOTE — Progress Notes (Signed)
Pt called stating that she has been having episodes of leg cramps and muscle tiredness since starting chlorthalidone. Pt reports that she has also noticed brain fogginess and increased sleepiness as well. Pt also noted that she noticed possible decreased urine output compared to previous baseline. Pt reports that she has been drinking ~3L/day. Pt still urinating, but pt reports that she feels like the urine output is lower than previous usual.   Pt reports that she accidentally stopped spironolactone when she started chlorthalidone.   Pt reports that she did go to [facility] on 10/11/21 but results arent visible on Epic. Per [facility] records, results listed below: BG: 95, BUN: 14, Scr: 1.22, eGFR: 49, Na: 137, K: **unable to collect sufficient specimen**.   Pt was recommended to get repeat labs, but pt wasn't notified by [facility]. Home BP readings have improved since starting chlorthalidone. Pt agreeable to get repeat labs to ensure pt's electrolyte and renal function remain stable and being able to document the results on Epic.

## 2021-10-19 ENCOUNTER — Other Ambulatory Visit: Payer: Self-pay | Admitting: Cardiology

## 2021-10-19 DIAGNOSIS — I10 Essential (primary) hypertension: Secondary | ICD-10-CM

## 2021-10-20 LAB — BASIC METABOLIC PANEL
BUN/Creatinine Ratio: 16 (ref 12–28)
BUN: 19 mg/dL (ref 8–27)
CO2: 24 mmol/L (ref 20–29)
Calcium: 8.6 mg/dL — ABNORMAL LOW (ref 8.7–10.3)
Chloride: 88 mmol/L — ABNORMAL LOW (ref 96–106)
Creatinine, Ser: 1.19 mg/dL — ABNORMAL HIGH (ref 0.57–1.00)
Glucose: 117 mg/dL — ABNORMAL HIGH (ref 70–99)
Potassium: 3.3 mmol/L — ABNORMAL LOW (ref 3.5–5.2)
Sodium: 126 mmol/L — ABNORMAL LOW (ref 134–144)
eGFR: 50 mL/min/{1.73_m2} — ABNORMAL LOW (ref >59)

## 2021-10-24 NOTE — Addendum Note (Signed)
Addended by: Cassell Clement T on: 10/24/2021 10:35 AM   Modules accepted: Orders

## 2021-10-24 NOTE — Progress Notes (Signed)
Called and LVM for pt to stop chlorthalidone as discussed. Called and confirmed with pt today that she stopped chlorthalidone as directed. Reviewed lab results with pt. Concerns of hyponatremia, hypokalemia, hypochloremia.  Pt denies any recent complains of diarrhea, vomiting, blood loss. Reports that her dose of carbamazepine continues to remain stable at 200 mg BID. Pt does reports that she had also been actively reducing her dietary potassium intake after looking up her symptoms online and being concerned of hyperkalemia. Pt agreeable to stopping chlorthalidone and returning  to regular dietary potassium intake. Pt's home BP returns continue to improve. Avg BP over the past week of 139/85. Currently taking amlodipine 10 mg and valsartan 320 mg.  Will continue remote BP monitoring and having pt repeat labs in 1 week. If BP readings  trend back up, may consider adding hydralazine 50 mg TID.

## 2021-10-25 ENCOUNTER — Other Ambulatory Visit: Payer: Self-pay

## 2021-10-25 DIAGNOSIS — I10 Essential (primary) hypertension: Secondary | ICD-10-CM

## 2021-10-26 ENCOUNTER — Ambulatory Visit: Payer: Medicare Other | Admitting: Cardiology

## 2021-10-28 LAB — BASIC METABOLIC PANEL
BUN/Creatinine Ratio: 11 — ABNORMAL LOW (ref 12–28)
BUN: 13 mg/dL (ref 8–27)
CO2: 25 mmol/L (ref 20–29)
Calcium: 9.1 mg/dL (ref 8.7–10.3)
Chloride: 95 mmol/L — ABNORMAL LOW (ref 96–106)
Creatinine, Ser: 1.16 mg/dL — ABNORMAL HIGH (ref 0.57–1.00)
Glucose: 97 mg/dL (ref 70–99)
Potassium: 3.7 mmol/L (ref 3.5–5.2)
Sodium: 134 mmol/L (ref 134–144)
eGFR: 52 mL/min/{1.73_m2} — ABNORMAL LOW (ref >59)

## 2021-11-02 NOTE — Progress Notes (Signed)
Results reviewed with pt. Pt notes that her complains of lower extremity pain and fatigue symptoms have improved significantly since stopping chlorthalidone. BP continues to slowly trend upwards and remains labile. Avg BP over the past two weeks of 146/89. Pt would like to wait till next OV before discussing further antihypertensive medication management

## 2021-11-09 ENCOUNTER — Other Ambulatory Visit: Payer: Self-pay | Admitting: Cardiology

## 2021-11-09 ENCOUNTER — Ambulatory Visit: Payer: Medicare Other | Admitting: Cardiology

## 2021-11-09 ENCOUNTER — Encounter: Payer: Self-pay | Admitting: Cardiology

## 2021-11-09 ENCOUNTER — Other Ambulatory Visit: Payer: Self-pay

## 2021-11-09 VITALS — BP 152/79 | HR 77 | Temp 97.3°F | Resp 16 | Ht 65.0 in | Wt 174.8 lb

## 2021-11-09 DIAGNOSIS — I10 Essential (primary) hypertension: Secondary | ICD-10-CM

## 2021-11-09 DIAGNOSIS — I340 Nonrheumatic mitral (valve) insufficiency: Secondary | ICD-10-CM

## 2021-11-09 MED ORDER — ISOSORB DINITRATE-HYDRALAZINE 20-37.5 MG PO TABS: 1.0000 | ORAL_TABLET | Freq: Three times a day (TID) | ORAL | 0 refills | Status: DC

## 2021-11-09 NOTE — Progress Notes (Signed)
Date:  11/09/2021   ID:  [name], DOB 21-Aug-1956, MRN [id number]  PCP:  Bartholome Bill, MD  Cardiologist:  Rex Kras, DO, [facility]  (established care 04/12/2021)  Date: 11/09/21 Last Office Visit: 08/08/2021  Chief Complaint  Patient presents with   Hypertension   Follow-up    HPI  [name] is a 66 y.o. female who presents to the office with a chief complaint of " blood pressure follow up." Patient's past medical history and cardiovascular risk factors include: Former smoker, hypertension.   Initially referred for evaluation of syncope and collapse which is likely due to her over correction of her blood pressures after that her medications were down titrated.  During prior office visits patient was complaining of chest pain which appears to be noncardiac based on symptoms but given her risk factors she underwent an echocardiogram and GXT.  GXT was reported to be abnormal and patient later underwent nuclear stress test which was overall low risk study but she was noted to have a hypertensive response to exercise.  Therefore, shared decision was to uptitrate medical therapy.  Patient is enrolled into principal care management and her blood pressures have been managed remotely since last office visit.  She responded very well to chlorthalidone; however, she developed hyponatremia and hypokalemia.  For which chlorthalidone was discontinued.  Repeat blood work has noted improvement in her electrolytes and renal function.  Based on principal care management 1 month blood pressure average was 150/91 mmHg with a heart rate of 69 bpm.  Patient is disheartened as she continues to feel tired, fatigue despite being on her blood pressure medications.  She denies angina pectoris or heart failure symptoms.  FUNCTIONAL STATUS: No structured exercise program or daily routine.    ALLERGIES: Allergies  Allergen Reactions   Codeine    Dilantin [Phenytoin Sodium Extended]    Librium  [Chlordiazepoxide Hcl]    Tylenol [Acetaminophen] Other (See Comments)    Sensitivity- may take 1-2 doses- but has chest pain after that    MEDICATION LIST PRIOR TO VISIT: Current Meds  Medication Sig   carbamazepine (TEGRETOL XR) 200 MG 12 hr tablet Take 200 mg by mouth 2 (two) times daily.    cetirizine (ZYRTEC) 10 MG tablet Take by mouth.   Cholecalciferol (VITAMIN D-3) 25 MCG (1000 UT) CAPS Take 1 capsule by mouth daily at 12 noon.   ferrous sulfate 325 (65 FE) MG EC tablet Take 325 mg by mouth 2 (two) times daily.   fluocinonide (LIDEX) 0.05 % external solution Apply 1 application topically as needed.   isosorbide-hydrALAZINE (BIDIL) 20-37.5 MG tablet Take 1 tablet by mouth 3 (three) times daily.   Magnesium 400 MG CAPS Take 1 capsule by mouth at bedtime.   Multiple Vitamin (MULTIVITAMIN PO) Take 1 tablet by mouth daily at 12 noon. Alive Women   Multiple Vitamins-Minerals (HAIR/SKIN/NAILS/BIOTIN) TABS Take 1 tablet by mouth daily at 12 noon.   tiZANidine (ZANAFLEX) 2 MG tablet Take 1 tablet by mouth as needed.   [DISCONTINUED] amLODipine (NORVASC) 10 MG tablet Take 1 tablet (10 mg total) by mouth daily at 10 pm.   [DISCONTINUED] valsartan (DIOVAN) 320 MG tablet Take 1 tablet (320 mg total) by mouth every morning.     PAST MEDICAL HISTORY: Past Medical History:  Diagnosis Date   Connective tissue disorder ([place])    Hypertension    Seizures ([place])     PAST SURGICAL HISTORY: Past Surgical History:  Procedure Laterality Date  CHOLECYSTECTOMY     HERNIA REPAIR     left foot surgery     TUBAL LIGATION      FAMILY HISTORY: The patient family history includes Cancer in her brother, father, maternal grandmother, and mother; Hypertension in her mother.  SOCIAL HISTORY:  The patient  reports that she has never smoked. She has never used smokeless tobacco. She reports that she does not drink alcohol and does not use drugs.  REVIEW OF SYSTEMS: Review of Systems  Constitutional:  Positive for malaise/fatigue. Negative for chills and fever.  HENT:  Negative for hoarse voice and nosebleeds.   Eyes:  Negative for discharge, double vision and pain.  Cardiovascular:  Negative for chest pain, claudication, dyspnea on exertion, leg swelling, near-syncope, orthopnea, palpitations, paroxysmal nocturnal dyspnea and syncope.  Respiratory:  Negative for hemoptysis and shortness of breath.   Musculoskeletal:  Negative for muscle cramps and myalgias.  Gastrointestinal:  Negative for abdominal pain, constipation, diarrhea, hematemesis, hematochezia, melena, nausea and vomiting.  Neurological:  Negative for dizziness and light-headedness.   PHYSICAL EXAM: Vitals with BMI 11/09/2021 08/08/2021 07/05/2021  Height 5' 5"  5' 5"  -  Weight 174 lbs 13 oz 177 lbs 10 oz -  BMI 30.16 01.09 -  Systolic 323 557 322  Diastolic 79 86 90  Pulse 77 67 65   CONSTITUTIONAL: Well-developed and well-nourished. No acute distress.  SKIN: Skin is warm and dry. No rash noted. No cyanosis. No pallor. No jaundice HEAD: Normocephalic and atraumatic.  EYES: No scleral icterus MOUTH/THROAT: Moist oral membranes.  NECK: No JVD present. No thyromegaly noted. No carotid bruits  LYMPHATIC: No visible cervical adenopathy.  CHEST Normal respiratory effort. No intercostal retractions  LUNGS: Clear to auscultation bilaterally. No stridor. No wheezes. No rales.  CARDIOVASCULAR: Regular rate and rhythm, positive S1-S2, no murmurs rubs or gallops appreciated. ABDOMINAL: Soft, nontender, nondistended, positive bowel sounds all 4 quadrants. No apparent ascites.  EXTREMITIES: No peripheral edema, warm to touch. HEMATOLOGIC: No significant bruising NEUROLOGIC: Oriented to person, place, and time. Nonfocal. Normal muscle tone.  PSYCHIATRIC: Normal mood and affect. Normal behavior. Cooperative  No significant change in physical examination  CARDIAC DATABASE: EKG: 04/12/2021: Normal sinus rhythm, 68 bpm, normal axis,  nonspecific T wave abnormality, without underlying injury pattern.   Echocardiogram: 06/10/2021: Normal LV systolic function with visual EF 55-60%. Left ventricle cavity is normal in size. MIld to moderate left ventricular hypertrophy. Normal global wall motion. Indeterminate diastolic filling pattern, elevated LAP. Mild (Grade I) aortic regurgitation. Mild (Grade I) mitral regurgitation. Mild tricuspid regurgitation. No evidence of pulmonary hypertension. RVSP measures 31 mmHg. IVC is normal with a respiratory response of <50%. No prior study for comparison.   Stress Testing: Exercise treadmill stress test 06/10/2021: Functional status: Poor.  Chest pain: No.  Reason for stopping exercise: Fatigue/weakness.  Hypertensive response to exercise: No.  Exercise time 4 minutes 26 seconds on Bruce protocol, achieved 6.36 METS, 86% APMHR.  Stress ECG positive for ischemia.   Abnormal GXT. Consider further cardiac work up if clinically indicated.  Exercise nuclear stress test 07/26/2021:  Equivocal ECG stress. The patient exercised for 4 minutes and 30 seconds of a Bruce protocol, achieving approximately 6.42 METs. Hypertensive blood pressure response, peak blood pressure 220/100 mmHg. Resting EKG normal sinus rhythm, nonspecific ST-T abnormality. Peak EKG 2 mm downsloping ST depression in inferolateral leads. ST changes back to baseline at 2 minutes into recovery. No chest pain, Dyspnea present.  Myocardial perfusion is normal.  Overall LV systolic function  is normal without regional wall motion abnormalities. Stress LV EF: 46%. but visually appears normal.  No previous exam available for comparison. Low risk study.   Heart Catheterization: 11/19 2003 by Dr. Daneen Schick III at [facility]:  1. Essentially normal coronary arteries.  2. Normal left ventricular function.  LABORATORY DATA: No flowsheet data found.  CMP Latest Ref Rng & Units 10/27/2021 10/19/2021 10/11/2021  Glucose 70 -  99 mg/dL 97 117(H) 95  BUN 8 - 27 mg/dL 13 19 14   Creatinine 0.57 - 1.00 mg/dL 1.16(H) 1.19(H) 1.22(H)  Sodium 134 - 144 mmol/L 134 126(L) 137  Potassium 3.5 - 5.2 mmol/L 3.7 3.3(L) CANCELED  Chloride 96 - 106 mmol/L 95(L) 88(L) 102  CO2 20 - 29 mmol/L 25 24 16(L)  Calcium 8.7 - 10.3 mg/dL 9.1 8.6(L) 9.1    Lipid Panel  No results found for: CHOL, TRIG, HDL, CHOLHDL, VLDL, LDLCALC, LDLDIRECT, LABVLDL  No components found for: NTPROBNP No results for input(s): PROBNP in the last 8760 hours. No results for input(s): TSH in the last 8760 hours.  BMP Recent Labs    10/11/21 0940 10/19/21 1441 10/27/21 1514  NA 137 126* 134  K CANCELED 3.3* 3.7  CL 102 88* 95*  CO2 16* 24 25  GLUCOSE 95 117* 97  BUN 14 19 13   CREATININE 1.22* 1.19* 1.16*  CALCIUM 9.1 8.6* 9.1    HEMOGLOBIN A1C No results found for: HGBA1C, MPG  External Labs: Collected: 03/30/2021 available in Care Everywhere Hemoglobin 10.1 g/dL, hematocrit 20.7% Magnesium 1.5 BNP 39 TSH 1.88. Sodium 126, potassium 3.9, chloride 95, bicarb 24, BUN 26 Creatinine 1.36 mg/dL. eGFR: 43 mL/min per 1.73 m AST 19, ALT 16 (normal), alkaline phosphatase 134 (elevated)  06/24/2020: Lipid profile: Total cholesterol 205, triglycerides 84, HDL 62, LDL 126, non-HDL 143.  IMPRESSION:    ICD-10-CM   1. Benign hypertension  I10 EKG 12-Lead    isosorbide-hydrALAZINE (BIDIL) 20-37.5 MG tablet    2. Mild mitral regurgitation  I34.0        RECOMMENDATIONS: [name] is a 66 y.o. female whose past medical history and cardiac risk factors include: Former smoker, hypertension.   Benign hypertension Noted to have hypertensive response to exercise. Patient requested assistance with blood pressure management until she has a chance to follow-up with PCP which is scheduled for July 2023. During prior office visits her medications were uptitrated including addition of spironolactone and chlorthalidone.  Blood pressures improved  with chlorthalidone however she also developed hyponatremia and hypokalemia and therefore the medication was discontinued. Prescription management data reviewed -1 month blood pressure averages 150/91 mmHg Recommend up titration of medical therapy -patient understands it is important but still is hesitant as she continues to feel tired and fatigued.  I have asked her to discuss other causes of feeling tired and fatigue with PCP (i.e. thyroid, anemia, vitamin B 12 deficiency, folate deficiency, etc.) Start BiDil 20/37.5 mg p.o. twice daily Recommended sleep study evaluation as well.   Reemphasized importance of consuming a low-salt diet.  Mild mitral regurgitation Monitor for now. Recommend an echo in 3 to 5 years from last echo to evaluate disease progression.  Or sooner if change in clinical status.  FINAL MEDICATION LIST END OF ENCOUNTER: Meds ordered this encounter  Medications   isosorbide-hydrALAZINE (BIDIL) 20-37.5 MG tablet    Sig: Take 1 tablet by mouth 3 (three) times daily.    Dispense:  90 tablet    Refill:  0  Medications Discontinued During This Encounter  Medication Reason   spironolactone (ALDACTONE) 25 MG tablet      Current Outpatient Medications:    carbamazepine (TEGRETOL XR) 200 MG 12 hr tablet, Take 200 mg by mouth 2 (two) times daily. , Disp: , Rfl:    cetirizine (ZYRTEC) 10 MG tablet, Take by mouth., Disp: , Rfl:    Cholecalciferol (VITAMIN D-3) 25 MCG (1000 UT) CAPS, Take 1 capsule by mouth daily at 12 noon., Disp: , Rfl:    ferrous sulfate 325 (65 FE) MG EC tablet, Take 325 mg by mouth 2 (two) times daily., Disp: , Rfl:    fluocinonide (LIDEX) 0.05 % external solution, Apply 1 application topically as needed., Disp: , Rfl:    isosorbide-hydrALAZINE (BIDIL) 20-37.5 MG tablet, Take 1 tablet by mouth 3 (three) times daily., Disp: 90 tablet, Rfl: 0   Magnesium 400 MG CAPS, Take 1 capsule by mouth at bedtime., Disp: , Rfl:    Multiple Vitamin (MULTIVITAMIN PO),  Take 1 tablet by mouth daily at 12 noon. Alive Women, Disp: , Rfl:    Multiple Vitamins-Minerals (HAIR/SKIN/NAILS/BIOTIN) TABS, Take 1 tablet by mouth daily at 12 noon., Disp: , Rfl:    tiZANidine (ZANAFLEX) 2 MG tablet, Take 1 tablet by mouth as needed., Disp: , Rfl:    amLODipine (NORVASC) 10 MG tablet, TAKE ONE TABLET BY MOUTH DAILY AT 10PM, Disp: 90 tablet, Rfl: 0   valsartan (DIOVAN) 320 MG tablet, TAKE ONE TABLET BY MOUTH EVERY MORNING, Disp: 90 tablet, Rfl: 0  Orders Placed This Encounter  Procedures   EKG 12-Lead   There are no Patient Instructions on file for this visit.   --Continue cardiac medications as reconciled in final medication list. --Return in about 3 months (around 02/09/2022) for Follow up BP. Or sooner if needed. --Continue follow-up with your primary care physician regarding the management of your other chronic comorbid conditions.  Patient's questions and concerns were addressed to her satisfaction. She voices understanding of the instructions provided during this encounter.   This note was created using a voice recognition software as a result there may be grammatical errors inadvertently enclosed that do not reflect the nature of this encounter. Every attempt is made to correct such errors.  Total time spent: 31 minutes.  Rex Kras, [place], [facility]  Pager: [phone] Office: [phone]

## 2021-11-15 ENCOUNTER — Telehealth: Payer: Self-pay | Admitting: Pharmacist

## 2021-11-15 NOTE — Telephone Encounter (Signed)
Agree.  ? ?I think she may be tired, fatigue, headaches due to either over correcting her BP or due to nitrate effect.  ? ?Change it to only hydralazine and re-evaluate.  ? ?Dr. Odis Hollingshead

## 2021-11-16 MED ORDER — HYDRALAZINE HCL 50 MG PO TABS: 50.0000 mg | ORAL_TABLET | Freq: Three times a day (TID) | ORAL | 3 refills | Status: DC

## 2021-11-16 NOTE — Telephone Encounter (Signed)
Called and discussed with pt. Pt agreeable to transition from BiDil to hydralazine and monitoring her symptoms. Hydralazine 50 mg TID sent to pt's pharmacy. Pt to monitor for changes in her symptoms and notify the office If her concerns of headaches, fatigue, weakness, low energy continues to persist or worsen.

## 2022-02-02 ENCOUNTER — Other Ambulatory Visit: Payer: Self-pay | Admitting: Cardiology

## 2022-02-02 DIAGNOSIS — I10 Essential (primary) hypertension: Secondary | ICD-10-CM

## 2022-02-16 ENCOUNTER — Encounter: Payer: Self-pay | Admitting: Student

## 2022-02-16 ENCOUNTER — Ambulatory Visit: Payer: Medicare Other | Admitting: Student

## 2022-02-16 ENCOUNTER — Ambulatory Visit: Payer: Medicare Other | Admitting: Cardiology

## 2022-02-16 VITALS — BP 137/79 | HR 68 | Temp 97.7°F | Resp 17 | Ht 65.0 in | Wt 174.4 lb

## 2022-02-16 DIAGNOSIS — I10 Essential (primary) hypertension: Secondary | ICD-10-CM

## 2022-02-16 MED ORDER — HYDRALAZINE HCL 100 MG PO TABS: 100.0000 mg | ORAL_TABLET | Freq: Three times a day (TID) | ORAL | 3 refills | Status: DC

## 2022-02-16 NOTE — Progress Notes (Signed)
Date:  02/16/2022   ID:  [name], DOB 1956-01-17, MRN [id number]  PCP:  Bartholome Bill, MD  Cardiologist:  Rex Kras, DO, [facility]  (established care 04/12/2021)  Date: 02/16/22 Last Office Visit: 08/08/2021  Chief Complaint  Patient presents with   Follow-up   Hypertension    3 MONTH    HPI  [name] is a 66 y.o. female who presents to the office with a chief complaint of " blood pressure follow up." Patient's past medical history and cardiovascular risk factors include: Former smoker, hypertension.   Initially referred for evaluation of syncope and collapse which is likely due to her over correction of her blood pressures after that her medications were down titrated.  Patient had previously complained of chest pain and subsequently underwent echocardiogram and GXT.  GXT was reported to be abnormal and she underwent nuclear stress testing which was overall low risk.  Echocardiogram revealed LVEF 55-60% with LVH and mild valvular disease, otherwise unremarkable.  Patient is presently enrolled in principal care management.  She has been unable to tolerate chlorthalidone in the past due to hyponatremia and hypokalemia, both of which improved following discontinuation of chlorthalidone.  Patient was last seen in our office 11/09/2021 by Dr. Terri Skains at which time given uncontrolled hypertension started hydralazine 50 mg p.o. 3 times daily and recommended sleep evaluation.  Patient now presents for 44-monthfollow-up of hypertension.  Patient is tolerating addition of hydralazine without issue.  However I reviewed remote monitoring and blood pressure remains uncontrolled.  FUNCTIONAL STATUS: No structured exercise program or daily routine.    ALLERGIES: Allergies  Allergen Reactions   Codeine    Dilantin [Phenytoin Sodium Extended]    Librium [Chlordiazepoxide Hcl]    Tylenol [Acetaminophen] Other (See Comments)    Sensitivity- may take 1-2 doses- but has chest pain after  that    MEDICATION LIST PRIOR TO VISIT: Current Meds  Medication Sig   amLODipine (NORVASC) 10 MG tablet TAKE ONE TABLET BY MOUTH DAILY AT 10PM   carbamazepine (TEGRETOL XR) 200 MG 12 hr tablet Take 200 mg by mouth 2 (two) times daily.    cetirizine (ZYRTEC) 10 MG tablet Take by mouth.   Cholecalciferol (VITAMIN D-3) 25 MCG (1000 UT) CAPS Take 1 capsule by mouth daily at 12 noon.   ferrous sulfate 325 (65 FE) MG EC tablet Take 325 mg by mouth 2 (two) times daily.   fluocinonide (LIDEX) 0.05 % external solution Apply 1 application topically as needed.   hydrALAZINE (APRESOLINE) 50 MG tablet Take 1 tablet by mouth daily.   Magnesium 400 MG CAPS Take 1 capsule by mouth at bedtime.   Multiple Vitamin (MULTIVITAMIN PO) Take 1 tablet by mouth daily at 12 noon. Alive Women   Multiple Vitamins-Minerals (HAIR/SKIN/NAILS/BIOTIN) TABS Take 1 tablet by mouth daily at 12 noon.   RESTASIS 0.05 % ophthalmic emulsion Place 1 drop into both eyes in the morning and at bedtime.   tiZANidine (ZANAFLEX) 2 MG tablet Take 1 tablet by mouth as needed.   valsartan (DIOVAN) 320 MG tablet TAKE ONE TABLET BY MOUTH EVERY MORNING   [DISCONTINUED] hydrALAZINE (APRESOLINE) 50 MG tablet Take 1 tablet (50 mg total) by mouth 3 (three) times daily.     PAST MEDICAL HISTORY: Past Medical History:  Diagnosis Date   Connective tissue disorder ([facility]    Hypertension    [place]iz[place]     PAST SURGICAL HISTORY: Past Surgical History:  Procedure Laterality Date  CHOLECYSTECTOMY     HERNIA REPAIR     left foot surgery     TUBAL LIGATION      FAMILY HISTORY: The patient family history includes Cancer in her brother, father, maternal grandmother, and mother; Hypertension in her mother.  SOCIAL HISTORY:  The patient  reports that she has never smoked. She has never used smokeless tobacco. She reports that she does not drink alcohol and does not use drugs.  REVIEW OF SYSTEMS: Review of Systems  Constitutional:  Positive for malaise/fatigue (unchanged). Negative for chills and fever.  HENT:  Negative for hoarse voice and nosebleeds.   Eyes:  Negative for discharge, double vision and pain.  Cardiovascular:  Negative for chest pain, claudication, dyspnea on exertion, leg swelling, near-syncope, orthopnea, palpitations, paroxysmal nocturnal dyspnea and syncope.  Respiratory:  Negative for hemoptysis and shortness of breath.   Musculoskeletal:  Negative for muscle cramps and myalgias.  Gastrointestinal:  Negative for abdominal pain, constipation, diarrhea, hematemesis, hematochezia, melena, nausea and vomiting.  Neurological:  Negative for dizziness and light-headedness.    PHYSICAL EXAM:    02/16/2022    1:16 PM 02/16/2022    1:15 PM 11/09/2021   11:27 AM  Vitals with BMI  Height  _0  _1   Weight  174 lbs 6 oz 174 lbs 13 oz  BMI  01.75 10.25  Systolic 852 778 242  Diastolic 79 83 79  Pulse 68 73 77   CONSTITUTIONAL: Well-developed and well-nourished. No acute distress.  SKIN: Skin is warm and dry. No rash noted. No cyanosis. No pallor. No jaundice HEAD: Normocephalic and atraumatic.  EYES: No scleral icterus MOUTH/THROAT: Moist oral membranes.  NECK: No JVD present. No thyromegaly noted. No carotid bruits  LYMPHATIC: No visible cervical adenopathy.  CHEST Normal respiratory effort. No intercostal retractions  LUNGS: Clear to auscultation bilaterally. No stridor. No wheezes. No rales.  CARDIOVASCULAR: Regular rate and rhythm, positive S1-S2, no murmurs rubs or gallops appreciated. ABDOMINAL: Soft, nontender, nondistended, positive bowel sounds all 4 quadrants. No apparent ascites.  EXTREMITIES: No peripheral edema, warm to touch. HEMATOLOGIC: No significant bruising NEUROLOGIC: Oriented to person, place, and time. Nonfocal. Normal muscle tone.  PSYCHIATRIC: Normal mood and affect. Normal behavior. Cooperative Physical exam unchanged compared to previous office visit.  CARDIAC  DATABASE: EKG: 04/12/2021: Normal sinus rhythm, 68 bpm, normal axis, nonspecific T wave abnormality, without underlying injury pattern.   Echocardiogram: 06/10/2021: Normal LV systolic function with visual EF 55-60%. Left ventricle cavity is normal in size. MIld to moderate left ventricular hypertrophy. Normal global wall motion. Indeterminate diastolic filling pattern, elevated LAP. Mild (Grade I) aortic regurgitation. Mild (Grade I) mitral regurgitation. Mild tricuspid regurgitation. No evidence of pulmonary hypertension. RVSP measures 31 mmHg. IVC is normal with a respiratory response of <50%. No prior study for comparison.   Stress Testing: Exercise treadmill stress test 06/10/2021: Functional status: Poor.  Chest pain: No.  Reason for stopping exercise: Fatigue/weakness.  Hypertensive response to exercise: No.  Exercise time 4 minutes 26 seconds on Bruce protocol, achieved 6.36 METS, 86% APMHR.  Stress ECG positive for ischemia.   Abnormal GXT. Consider further cardiac work up if clinically indicated.  Exercise nuclear stress test 07/26/2021:  Equivocal ECG stress. The patient exercised for 4 minutes and 30 seconds of a Bruce protocol, achieving approximately 6.42 METs. Hypertensive blood pressure response, peak blood pressure 220/100 mmHg. Resting EKG normal sinus rhythm, nonspecific ST-T abnormality. Peak EKG 2 mm downsloping ST depression in inferolateral leads. ST changes back to  baseline at 2 minutes into recovery. No chest pain, Dyspnea present.  Myocardial perfusion is normal.  Overall LV systolic function is normal without regional wall motion abnormalities. Stress LV EF: 46%. but visually appears normal.  No previous exam available for comparison. Low risk study.   Heart Catheterization: 11/19 2003 by Dr. Daneen Schick III at [facility]:  1. Essentially normal coronary arteries.  2. Normal left ventricular function.  LABORATORY DATA:     No data to display              Latest Ref Rng & Units 10/27/2021    3:14 PM 10/19/2021    2:41 PM 10/11/2021    9:40 AM  CMP  Glucose 70 - 99 mg/dL 97  117  95   BUN 8 - 27 mg/dL _0 Creatinine 0.57 - 1.00 mg/dL 1.16  1.19  1.22   Sodium 134 - 144 mmol/L 134  126  137   Potassium 3.5 - 5.2 mmol/L 3.7  3.3  CANCELED   Chloride 96 - 106 mmol/L 95  88  102   CO2 20 - 29 mmol/L _1 Calcium 8.7 - 10.3 mg/dL 9.1  8.6  9.1     Lipid Panel  No results found for: "CHOL", "TRIG", "HDL", "CHOLHDL", "VLDL", "LDLCALC", "LDLDIRECT", "LABVLDL"  No components found for: "NTPROBNP" No results for input(s): "PROBNP" in the last 8760 hours. No results for input(s): "TSH" in the last 8760 hours.  BMP Recent Labs    10/11/21 0940 10/19/21 1441 10/27/21 1514  NA 137 126* 134  K CANCELED 3.3* 3.7  CL 102 88* 95*  CO2 16* 24 25  GLUCOSE 95 117* 97  BUN _2 CREATININE 1.22* 1.19* 1.16*  CALCIUM 9.1 8.6* 9.1    HEMOGLOBIN A1C No results found for: "HGBA1C", "MPG"  External Labs: Collected: 03/30/2021 available in Care Everywhere Hemoglobin 10.1 g/dL, hematocrit 20.7% Magnesium 1.5 BNP 39 TSH 1.88. Sodium 126, potassium 3.9, chloride 95, bicarb 24, BUN 26 Creatinine 1.36 mg/dL. eGFR: 43 mL/min per 1.73 m AST 19, ALT 16 (normal), alkaline phosphatase 134 (elevated)  06/24/2020: Lipid profile: Total cholesterol 205, triglycerides 84, HDL 62, LDL 126, non-HDL 143.  IMPRESSION:    ICD-10-CM   1. Benign hypertension  I10        RECOMMENDATIONS: [name] is a 66 y.o. female whose past medical history and cardiac risk factors include: Former smoker, hypertension.   Benign hypertension Blood pressure remains uncontrolled on home monitoring.  Patient reports compliance with DASH diet as well as current medication regimen. Blood pressures improved with chlorthalidone however she also developed hyponatremia and hypokalemia and therefore the medication was  discontinued. Prescription management data reviewed Continue remote patient monitoring. Increase hydralazine from 50 mg to 100 mg p.o. 3 times daily. Patient has appointment with PCP in 3 months, will defer follow-up on hypertension management to PCP at that time.  Mild mitral regurgitation Continue to monitor. Recommend an echo in 3 to 5 years from last echo to evaluate disease progression.  Or sooner if change in clinical status.  FINAL MEDICATION LIST END OF ENCOUNTER: No orders of the defined types were placed in this encounter.    Medications Discontinued During This Encounter  Medication Reason   hydrALAZINE (APRESOLINE) 50 MG tablet Duplicate     Current Outpatient Medications:    amLODipine (NORVASC) 10 MG tablet, TAKE ONE TABLET BY MOUTH DAILY  AT 10PM, Disp: 90 tablet, Rfl: 0   carbamazepine (TEGRETOL XR) 200 MG 12 hr tablet, Take 200 mg by mouth 2 (two) times daily. , Disp: , Rfl:    cetirizine (ZYRTEC) 10 MG tablet, Take by mouth., Disp: , Rfl:    Cholecalciferol (VITAMIN D-3) 25 MCG (1000 UT) CAPS, Take 1 capsule by mouth daily at 12 noon., Disp: , Rfl:    ferrous sulfate 325 (65 FE) MG EC tablet, Take 325 mg by mouth 2 (two) times daily., Disp: , Rfl:    fluocinonide (LIDEX) 0.05 % external solution, Apply 1 application topically as needed., Disp: , Rfl:    hydrALAZINE (APRESOLINE) 50 MG tablet, Take 1 tablet by mouth daily., Disp: , Rfl:    Magnesium 400 MG CAPS, Take 1 capsule by mouth at bedtime., Disp: , Rfl:    Multiple Vitamin (MULTIVITAMIN PO), Take 1 tablet by mouth daily at 12 noon. Alive Women, Disp: , Rfl:    Multiple Vitamins-Minerals (HAIR/SKIN/NAILS/BIOTIN) TABS, Take 1 tablet by mouth daily at 12 noon., Disp: , Rfl:    RESTASIS 0.05 % ophthalmic emulsion, Place 1 drop into both eyes in the morning and at bedtime., Disp: , Rfl:    tiZANidine (ZANAFLEX) 2 MG tablet, Take 1 tablet by mouth as needed., Disp: , Rfl:    valsartan (DIOVAN) 320 MG tablet, TAKE ONE  TABLET BY MOUTH EVERY MORNING, Disp: 90 tablet, Rfl: 0  No orders of the defined types were placed in this encounter.  There are no Patient Instructions on file for this visit.   --Continue cardiac medications as reconciled in final medication list. --No follow-ups on file. Or sooner if needed. --Continue follow-up with your primary care physician regarding the management of your other chronic comorbid conditions.  Patient's questions and concerns were addressed to her satisfaction. She voices understanding of the instructions provided during this encounter.   This note was created using a voice recognition software as a result there may be grammatical errors inadvertently enclosed that do not reflect the nature of this encounter. Every attempt is made to correct such errors.   Alethia Berthold, PA-C 02/16/2022, 2:08 PM Office: [phone]

## 2022-02-20 ENCOUNTER — Other Ambulatory Visit: Payer: Self-pay | Admitting: Cardiology

## 2022-02-20 DIAGNOSIS — I10 Essential (primary) hypertension: Secondary | ICD-10-CM

## 2022-05-11 ENCOUNTER — Other Ambulatory Visit: Payer: Self-pay | Admitting: Cardiology

## 2022-05-11 DIAGNOSIS — I10 Essential (primary) hypertension: Secondary | ICD-10-CM

## 2022-05-26 ENCOUNTER — Other Ambulatory Visit: Payer: Self-pay

## 2022-05-26 DIAGNOSIS — I10 Essential (primary) hypertension: Secondary | ICD-10-CM

## 2022-05-26 MED ORDER — AMLODIPINE BESYLATE 10 MG PO TABS: ORAL_TABLET | ORAL | 1 refills | Status: DC

## 2022-07-07 ENCOUNTER — Other Ambulatory Visit: Payer: Self-pay | Admitting: Family Medicine

## 2022-07-07 DIAGNOSIS — Z1231 Encounter for screening mammogram for malignant neoplasm of breast: Secondary | ICD-10-CM

## 2022-08-14 ENCOUNTER — Other Ambulatory Visit: Payer: Self-pay | Admitting: Cardiology

## 2022-08-14 DIAGNOSIS — I10 Essential (primary) hypertension: Secondary | ICD-10-CM

## 2022-08-15 ENCOUNTER — Ambulatory Visit: Payer: Medicare Other | Admitting: Cardiology

## 2022-08-15 ENCOUNTER — Encounter: Payer: Self-pay | Admitting: Cardiology

## 2022-08-15 VITALS — BP 144/71 | HR 71 | Resp 14 | Ht 65.0 in | Wt 176.2 lb

## 2022-08-15 DIAGNOSIS — G4730 Sleep apnea, unspecified: Secondary | ICD-10-CM

## 2022-08-15 DIAGNOSIS — I340 Nonrheumatic mitral (valve) insufficiency: Secondary | ICD-10-CM

## 2022-08-15 DIAGNOSIS — I10 Essential (primary) hypertension: Secondary | ICD-10-CM

## 2022-08-15 DIAGNOSIS — E7800 Pure hypercholesterolemia, unspecified: Secondary | ICD-10-CM

## 2022-08-15 NOTE — Progress Notes (Signed)
Date:  08/15/2022   ID:  [name], DOB 1955-09-21, MRN [id number]  PCP:  Bartholome Bill, MD  Cardiologist:  Rex Kras, DO, [facility]  (established care 04/12/2021)  Chief Complaint  Patient presents with   Hypertension   Follow-up    6 months    HPI  [name] is a 66 y.o. female whose past medical history and cardiovascular risk factors include: Former smoker, hypertension, sleep apnea on CPAP.   Patient was referred to the practice for evaluation of syncope which was likely secondary to overcorrection of her benign essential hypertension.  With down titration of medical therapy she was asymptomatic.  During follow-up visits patient was having symptoms of precordial chest pain and given her risk factors did undergo ischemic workup as outlined below.  During the exercise treadmill stress test she was noted to have hypertensive response to exercise and therefore antihypertensive medications were further uptitrated.  Patient was enrolled into principal care management and her blood pressures have improved significantly.  Her average blood pressure at home was 136/86 with a pulse of 72 bpm.  She is also been evaluated for sleep apnea for which she is being treated with CPAP.  Of note, chlorthalidone in the past has caused hyponatremia and hypokalemia.  And patient has not tolerated isosorbide and antihypertensive medications.  Clinically denies anginal discomfort or heart failure symptoms.  FUNCTIONAL STATUS: No structured exercise program or daily routine.    ALLERGIES: Allergies  Allergen Reactions   Codeine    Dilantin [Phenytoin Sodium Extended]    Librium [Chlordiazepoxide Hcl]    Tylenol [Acetaminophen] Other (See Comments)    Sensitivity- may take 1-2 doses- but has chest pain after that    MEDICATION LIST PRIOR TO VISIT: Current Meds  Medication Sig   amLODipine (NORVASC) 10 MG tablet TAKE ONE TABLET BY MOUTH DAILY AT 10PM   carbamazepine (TEGRETOL XR)  200 MG 12 hr tablet Take 200 mg by mouth 2 (two) times daily.    cetirizine (ZYRTEC) 10 MG tablet Take by mouth.   Cholecalciferol (VITAMIN D-3) 25 MCG (1000 UT) CAPS Take 1 capsule by mouth daily at 12 noon.   ferrous sulfate 325 (65 FE) MG EC tablet Take 325 mg by mouth 2 (two) times daily.   fluocinonide (LIDEX) 0.05 % external solution Apply 1 application topically as needed.   hydrALAZINE (APRESOLINE) 100 MG tablet Take 1 tablet (100 mg total) by mouth 3 (three) times daily.   Magnesium 400 MG CAPS Take 1 capsule by mouth at bedtime.   Multiple Vitamin (MULTIVITAMIN PO) Take 1 tablet by mouth daily at 12 noon. Alive Women   Multiple Vitamins-Minerals (HAIR/SKIN/NAILS/BIOTIN) TABS Take 1 tablet by mouth daily at 12 noon.   RESTASIS 0.05 % ophthalmic emulsion Place 1 drop into both eyes in the morning and at bedtime.   tiZANidine (ZANAFLEX) 2 MG tablet Take 1 tablet by mouth as needed.   valsartan (DIOVAN) 320 MG tablet TAKE ONE TABLET BY MOUTH EVERY MORNING     PAST MEDICAL HISTORY: Past Medical History:  Diagnosis Date   Connective tissue disorder ([place])    Hypertension    Seizures ([place])     PAST SURGICAL HISTORY: Past Surgical History:  Procedure Laterality Date   CHOLECYSTECTOMY     HERNIA REPAIR     left foot surgery     TUBAL LIGATION      FAMILY HISTORY: The patient family history includes Cancer in her brother, father, maternal grandmother, and  mother; Hypertension in her mother.  SOCIAL HISTORY:  The patient  reports that she has never smoked. She has never used smokeless tobacco. She reports that she does not drink alcohol and does not use drugs.  REVIEW OF SYSTEMS: Review of Systems  Constitutional: Positive for malaise/fatigue (improved). Negative for chills and fever.  HENT:  Negative for hoarse voice and nosebleeds.   Eyes:  Negative for discharge, double vision and pain.  Cardiovascular:  Negative for chest pain, claudication, dyspnea on exertion, leg  swelling, near-syncope, orthopnea, palpitations, paroxysmal nocturnal dyspnea and syncope.  Respiratory:  Negative for hemoptysis and shortness of breath.   Musculoskeletal:  Negative for muscle cramps and myalgias.  Gastrointestinal:  Negative for abdominal pain, constipation, diarrhea, hematemesis, hematochezia, melena, nausea and vomiting.  Neurological:  Negative for dizziness and light-headedness.    PHYSICAL EXAM:    08/15/2022    2:52 PM 02/16/2022    1:16 PM 02/16/2022    1:15 PM  Vitals with BMI  Height _0   _1   Weight 176 lbs 3 oz  174 lbs 6 oz  BMI 26.33  35.45  Systolic 625 638 937  Diastolic 71 79 83  Pulse 71 68 73   Physical Exam  Constitutional: No distress.  Age appropriate, hemodynamically stable.   Neck: No JVD present.  Cardiovascular: Normal rate, regular rhythm, S1 normal, S2 normal, intact distal pulses and normal pulses. Exam reveals no gallop, no S3 and no S4.  No murmur heard. Pulmonary/Chest: Effort normal and breath sounds normal. No stridor. She has no wheezes. She has no rales.  Abdominal: Soft. Bowel sounds are normal. She exhibits no distension. There is no abdominal tenderness.  Musculoskeletal:        General: No edema.     Cervical back: Neck supple.  Neurological: She is alert and oriented to person, place, and time. She has intact cranial nerves (2-12).  Skin: Skin is warm and moist.   CARDIAC DATABASE: EKG: 08/15/2022: Normal sinus rhythm, 69 bpm, normal axis, without underlying ischemia or injury pattern.  Echocardiogram: 06/10/2021: Normal LV systolic function with visual EF 55-60%. Left ventricle cavity is normal in size. MIld to moderate left ventricular hypertrophy. Normal global wall motion. Indeterminate diastolic filling pattern, elevated LAP. Mild (Grade I) aortic regurgitation. Mild (Grade I) mitral regurgitation. Mild tricuspid regurgitation. No evidence of pulmonary hypertension. RVSP measures 31 mmHg. IVC is normal with a  respiratory response of <50%. No prior study for comparison.   Stress Testing: Exercise treadmill stress test 06/10/2021: Functional status: Poor.  Chest pain: No.  Reason for stopping exercise: Fatigue/weakness.  Hypertensive response to exercise: No.  Exercise time 4 minutes 26 seconds on Bruce protocol, achieved 6.36 METS, 86% APMHR.  Stress ECG positive for ischemia.   Abnormal GXT. Consider further cardiac work up if clinically indicated.  Exercise nuclear stress test 07/26/2021:  Equivocal ECG stress. The patient exercised for 4 minutes and 30 seconds of a Bruce protocol, achieving approximately 6.42 METs. Hypertensive blood pressure response, peak blood pressure 220/100 mmHg. Resting EKG normal sinus rhythm, nonspecific ST-T abnormality. Peak EKG 2 mm downsloping ST depression in inferolateral leads. ST changes back to baseline at 2 minutes into recovery. No chest pain, Dyspnea present.  Myocardial perfusion is normal.  Overall LV systolic function is normal without regional wall motion abnormalities. Stress LV EF: 46%. but visually appears normal.  No previous exam available for comparison. Low risk study.   Heart Catheterization: 11/19 2003 by Dr. Daneen Schick III  at [facility]:  1. Essentially normal coronary arteries.  2. Normal left ventricular function.  LABORATORY DATA:     No data to display             Latest Ref Rng & Units 10/27/2021    3:14 PM 10/19/2021    2:41 PM 10/11/2021    9:40 AM  CMP  Glucose 70 - 99 mg/dL 97  117  95   BUN 8 - 27 mg/dL _0 Creatinine 0.57 - 1.00 mg/dL 1.16  1.19  1.22   Sodium 134 - 144 mmol/L 134  126  137   Potassium 3.5 - 5.2 mmol/L 3.7  3.3  CANCELED   Chloride 96 - 106 mmol/L 95  88  102   CO2 20 - 29 mmol/L _1 Calcium 8.7 - 10.3 mg/dL 9.1  8.6  9.1     Lipid Panel  No results found for: "CHOL", "TRIG", "HDL", "CHOLHDL", "VLDL", "LDLCALC", "LDLDIRECT", "LABVLDL"  No components found for:  "NTPROBNP" No results for input(s): "PROBNP" in the last 8760 hours. No results for input(s): "TSH" in the last 8760 hours.  BMP Recent Labs    10/11/21 0940 10/19/21 1441 10/27/21 1514  NA 137 126* 134  K CANCELED 3.3* 3.7  CL 102 88* 95*  CO2 16* 24 25  GLUCOSE 95 117* 97  BUN _2 CREATININE 1.22* 1.19* 1.16*  CALCIUM 9.1 8.6* 9.1    HEMOGLOBIN A1C No results found for: "HGBA1C", "MPG"  External Labs: Collected: 03/30/2021 available in Care Everywhere Hemoglobin 10.1 g/dL, hematocrit 20.7% Magnesium 1.5 BNP 39 TSH 1.88. Sodium 126, potassium 3.9, chloride 95, bicarb 24, BUN 26 Creatinine 1.36 mg/dL. eGFR: 43 mL/min per 1.73 m AST 19, ALT 16 (normal), alkaline phosphatase 134 (elevated)  06/24/2020: Lipid profile: Total cholesterol 205, triglycerides 84, HDL 62, LDL 126, non-HDL 143.  External Labs: Collected: July 06, 2022 available in [place]. Total cholesterol 204, triglycerides 66, HDL 67, LDL calculated 124 mg/dL, non-HDL 137 Sodium 137, potassium 4.2, chloride 103, bicarb 28. BUN 16, creatinine 1.28   IMPRESSION:    ICD-10-CM   1. Benign hypertension  I10 EKG 12-Lead    2. Mild mitral regurgitation  I34.0     3. Sleep apnea in adult  G47.30     4. Hypercholesteremia  E78.00        RECOMMENDATIONS: [name] is a 66 y.o. female whose past medical history and cardiac risk factors include: Former smoker, hypertension, sleep apnea on CPAP.   Benign hypertension Office blood pressures are not at goal. Home blood pressures are within acceptable limits. Ambulatory blood pressure monitoring results reviewed and noted above for further reference. Medications reconciled. Reemphasized importance of a low-salt diet. Of note, chlorthalidone resulted in hyponatremia and hypokalemia.  In addition, did not tolerate isosorbide.  Mild mitral regurgitation Asymptomatic. Monitor for now. As long as she is asymptomatic would recommend  follow-up echo in 3 to 5 years, as early as 2025.  Sleep apnea in adult Reemphasized the importance of CPAP compliance.  Hypercholesteremia Outside labs from October 2023 independently reviewed. Cholesterol levels are not well-controlled. Patient is currently working on lifestyle changes with PCP and will have her lipids checked in the next 3 months.  If the lifestyle changes resulting improvement in LDL levels no additional pharmacological therapy may be warranted.  However, if the numbers continue to trend up either consider pharmacological therapy  versus further risk stratification with coronary calcium score.  Patient is thankful for advice and will follow-up with PCP with regards to the management of lipids.  FINAL MEDICATION LIST END OF ENCOUNTER: No orders of the defined types were placed in this encounter.    There are no discontinued medications.    Current Outpatient Medications:    amLODipine (NORVASC) 10 MG tablet, TAKE ONE TABLET BY MOUTH DAILY AT 10PM, Disp: 90 tablet, Rfl: 1   carbamazepine (TEGRETOL XR) 200 MG 12 hr tablet, Take 200 mg by mouth 2 (two) times daily. , Disp: , Rfl:    cetirizine (ZYRTEC) 10 MG tablet, Take by mouth., Disp: , Rfl:    Cholecalciferol (VITAMIN D-3) 25 MCG (1000 UT) CAPS, Take 1 capsule by mouth daily at 12 noon., Disp: , Rfl:    ferrous sulfate 325 (65 FE) MG EC tablet, Take 325 mg by mouth 2 (two) times daily., Disp: , Rfl:    fluocinonide (LIDEX) 0.05 % external solution, Apply 1 application topically as needed., Disp: , Rfl:    hydrALAZINE (APRESOLINE) 100 MG tablet, Take 1 tablet (100 mg total) by mouth 3 (three) times daily., Disp: 270 tablet, Rfl: 3   Magnesium 400 MG CAPS, Take 1 capsule by mouth at bedtime., Disp: , Rfl:    Multiple Vitamin (MULTIVITAMIN PO), Take 1 tablet by mouth daily at 12 noon. Alive Women, Disp: , Rfl:    Multiple Vitamins-Minerals (HAIR/SKIN/NAILS/BIOTIN) TABS, Take 1 tablet by mouth daily at 12 noon., Disp: ,  Rfl:    RESTASIS 0.05 % ophthalmic emulsion, Place 1 drop into both eyes in the morning and at bedtime., Disp: , Rfl:    tiZANidine (ZANAFLEX) 2 MG tablet, Take 1 tablet by mouth as needed., Disp: , Rfl:    valsartan (DIOVAN) 320 MG tablet, TAKE ONE TABLET BY MOUTH EVERY MORNING, Disp: 90 tablet, Rfl: 0  Orders Placed This Encounter  Procedures   EKG 12-Lead   There are no Patient Instructions on file for this visit.   --Continue cardiac medications as reconciled in final medication list. --Return in about 1 year (around 08/16/2023) for Follow up, BP. Or sooner if needed. --Continue follow-up with your primary care physician regarding the management of your other chronic comorbid conditions.  Patient's questions and concerns were addressed to her satisfaction. She voices understanding of the instructions provided during this encounter.   This note was created using a voice recognition software as a result there may be grammatical errors inadvertently enclosed that do not reflect the nature of this encounter. Every attempt is made to correct such errors.  Rex Kras, [place], [facility]  Pager: [phone] Office: [phone]

## 2022-11-23 ENCOUNTER — Other Ambulatory Visit: Payer: Self-pay | Admitting: Cardiology

## 2022-11-23 DIAGNOSIS — I10 Essential (primary) hypertension: Secondary | ICD-10-CM

## 2022-12-04 ENCOUNTER — Other Ambulatory Visit: Payer: Self-pay | Admitting: Cardiology

## 2022-12-04 DIAGNOSIS — I10 Essential (primary) hypertension: Secondary | ICD-10-CM

## 2022-12-05 ENCOUNTER — Other Ambulatory Visit: Payer: Self-pay

## 2022-12-05 DIAGNOSIS — I10 Essential (primary) hypertension: Secondary | ICD-10-CM

## 2022-12-05 MED ORDER — AMLODIPINE BESYLATE 10 MG PO TABS: ORAL_TABLET | ORAL | 1 refills | Status: DC

## 2023-01-25 ENCOUNTER — Other Ambulatory Visit: Payer: Self-pay | Admitting: Cardiology

## 2023-01-25 DIAGNOSIS — I10 Essential (primary) hypertension: Secondary | ICD-10-CM

## 2023-04-25 ENCOUNTER — Other Ambulatory Visit: Payer: Self-pay

## 2023-04-25 DIAGNOSIS — I10 Essential (primary) hypertension: Secondary | ICD-10-CM

## 2023-04-25 MED ORDER — HYDRALAZINE HCL 100 MG PO TABS: 100.0000 mg | ORAL_TABLET | Freq: Three times a day (TID) | ORAL | 3 refills | Status: DC

## 2023-04-25 MED ORDER — VALSARTAN 320 MG PO TABS: 320.0000 mg | ORAL_TABLET | Freq: Every morning | ORAL | 1 refills | Status: DC

## 2023-05-31 ENCOUNTER — Other Ambulatory Visit: Payer: Self-pay | Admitting: Cardiology

## 2023-05-31 DIAGNOSIS — I10 Essential (primary) hypertension: Secondary | ICD-10-CM

## 2023-06-22 ENCOUNTER — Other Ambulatory Visit: Payer: Self-pay | Admitting: Nurse Practitioner

## 2023-06-22 DIAGNOSIS — R748 Abnormal levels of other serum enzymes: Secondary | ICD-10-CM

## 2023-07-12 ENCOUNTER — Ambulatory Visit
Admission: RE | Admit: 2023-07-12 | Discharge: 2023-07-12 | Disposition: A | Discharge status: Home / Self Care | Payer: Medicare Other | Source: Ambulatory Visit | Attending: Nurse Practitioner | Admitting: Nurse Practitioner

## 2023-07-12 DIAGNOSIS — R748 Abnormal levels of other serum enzymes: Secondary | ICD-10-CM

## 2023-07-30 ENCOUNTER — Other Ambulatory Visit: Payer: Self-pay | Admitting: Cardiology

## 2023-07-30 DIAGNOSIS — I10 Essential (primary) hypertension: Secondary | ICD-10-CM

## 2023-08-07 ENCOUNTER — Ambulatory Visit: Payer: Medicare Other | Attending: Cardiology | Admitting: Cardiology

## 2023-08-07 ENCOUNTER — Encounter: Payer: Self-pay | Admitting: Cardiology

## 2023-08-07 VITALS — BP 138/84 | HR 70 | Resp 16 | Ht 65.0 in | Wt 179.8 lb

## 2023-08-07 DIAGNOSIS — I10 Essential (primary) hypertension: Secondary | ICD-10-CM

## 2023-08-07 DIAGNOSIS — G4730 Sleep apnea, unspecified: Secondary | ICD-10-CM

## 2023-08-07 DIAGNOSIS — I340 Nonrheumatic mitral (valve) insufficiency: Secondary | ICD-10-CM | POA: Diagnosis not present

## 2023-08-07 DIAGNOSIS — E7800 Pure hypercholesterolemia, unspecified: Secondary | ICD-10-CM

## 2023-08-07 NOTE — Progress Notes (Signed)
Cardiology Office Note:  .   Date:  08/07/2023  ID:  [name], DOB 09/22/1955, MRN [id number] PCP:  Verlon Au, MD  Former Cardiology Providers: Elvin So, PA [facility] Providers Cardiologist:  Tessa Lerner, DO , [facility] (established care August 2022) Electrophysiologist:  None  Click to update primary MD,subspecialty MD or APP then REFRESH:1}    Chief Complaint  Patient presents with   Benign hypertension   Follow-up    1 year    History of Present Illness: [name] is a 67 y.o. African-American female whose past medical history and cardiovascular risk factors includes: Former smoker, hypertension.   Initially referred to the practice for evaluation of near syncope.  It was felt that her syncopal event was secondary to over correcting of her blood pressures.  She did undergo appropriate cardiovascular testing as outlined below.  Patient did not do well with chlorthalidone in the past as it caused hyponatremia and hypokalemia.  She was last seen in the office in June 2023 by and hydralazine was increased from 50 mg 3 times daily to 100 mg p.o. 3 times daily and recommended follow-up in 3 months CPE.  She presents today for 1 year follow-up.  Denies anginal chest pain or heart failure symptoms.  Patient currently is being worked up for autoimmune disease by rheumatology based on her blood work in [employer].  Patient also has been seen by hepatology according to her for liver workup as her enzymes were elevated.  Review of Systems: .   Review of Systems  Cardiovascular:  Negative for chest pain, claudication, irregular heartbeat, leg swelling, near-syncope, orthopnea, palpitations, paroxysmal nocturnal dyspnea and syncope.  Respiratory:  Negative for shortness of breath.   Hematologic/Lymphatic: Negative for bleeding problem.    Studies Reviewed:   EKG: EKG Interpretation Date/Time:  Tuesday August 07 2023 08:43:52 EST Text  Interpretation: Normal sinus rhythm Nonspecific T wave abnormality When compared with ECG of 21-Feb-2001 11:55, Nonspecific T wave abnormality no longer evident in Inferior leads Confirmed by Tessa Lerner [phone]) on 08/07/2023 8:59:01 AM  Echocardiogram: September 2022: LVEF 55 to 60%, mild AR, mild MR, mild TR, see report for additional details  Stress Testing: Exercise treadmill stress test September 2022: Abnormal GXT further testing recommended.  Exercise nuclear stress test November 2022: Normal myocardial perfusion, low risk study  Heart Catheterization: 11/19 2003 by Dr. Verdis Prime III at [facility]:  1. Essentially normal coronary arteries.  2. Normal left ventricular function.  RADIOLOGY: NA  Risk Assessment/Calculations:   NA   Labs:        No data to display             Latest Ref Rng & Units 10/27/2021    3:14 PM 10/19/2021    2:41 PM 10/11/2021    9:40 AM  BMP  Glucose 70 - 99 mg/dL 97  191  95   BUN 8 - 27 mg/dL 13  19  14    Creatinine 0.57 - 1.00 mg/dL 4.78  2.95  6.21   BUN/Creat Ratio 12 - 28 11  16  11    Sodium 134 - 144 mmol/L 134  126  137   Potassium 3.5 - 5.2 mmol/L 3.7  3.3  CANCELED   Chloride 96 - 106 mmol/L 95  88  102   CO2 20 - 29 mmol/L 25  24  16    Calcium 8.7 - 10.3 mg/dL 9.1  8.6  9.1  Latest Ref Rng & Units 10/27/2021    3:14 PM 10/19/2021    2:41 PM 10/11/2021    9:40 AM  CMP  Glucose 70 - 99 mg/dL 97  644  95   BUN 8 - 27 mg/dL 13  19  14    Creatinine 0.57 - 1.00 mg/dL 0.34  7.42  5.95   Sodium 134 - 144 mmol/L 134  126  137   Potassium 3.5 - 5.2 mmol/L 3.7  3.3  CANCELED   Chloride 96 - 106 mmol/L 95  88  102   CO2 20 - 29 mmol/L 25  24  16    Calcium 8.7 - 10.3 mg/dL 9.1  8.6  9.1     No results found for: "CHOL", "HDL", "LDLCALC", "LDLDIRECT", "TRIG", "CHOLHDL" No results for input(s): "LIPOA" in the last 8760 hours. No components found for: "NTPROBNP" No results for input(s): "PROBNP" in the last 8760  hours. No results for input(s): "TSH" in the last 8760 hours.  06/24/2020: Total cholesterol 205, triglycerides 84, HDL 62, LDL 126, non-HDL 143. Collected: July 06, 2022. Total cholesterol 204, triglycerides 66, HDL 67, LDL calculated 124 mg/dL, non-HDL 638  Physical Exam:    Today's Vitals   08/07/23 0837  BP: 138/84  Pulse: 70  Resp: 16  SpO2: 99%  Weight: 179 lb 12.8 oz (81.6 kg)  Height: 5\' 5"  (1.651 m)   Body mass index is 29.92 kg/m. Wt Readings from Last 3 Encounters:  08/07/23 179 lb 12.8 oz (81.6 kg)  08/15/22 176 lb 3.2 oz (79.9 kg)  02/16/22 174 lb 6.4 oz (79.1 kg)    Physical Exam  Constitutional: No distress.  Age appropriate, hemodynamically stable.   Neck: No JVD present.  Cardiovascular: Normal rate, regular rhythm, S1 normal, S2 normal, intact distal pulses and normal pulses. Exam reveals no gallop, no S3 and no S4.  No murmur heard. Pulmonary/Chest: Effort normal and breath sounds normal. No stridor. She has no wheezes. She has no rales.  Abdominal: Soft. Bowel sounds are normal. She exhibits no distension. There is no abdominal tenderness.  Musculoskeletal:        General: No edema.     Cervical back: Neck supple.  Neurological: She is alert and oriented to person, place, and time. She has intact cranial nerves (2-12).  Skin: Skin is warm and moist.     Impression & Recommendation(s):  Impression:   ICD-10-CM   1. Benign hypertension  I10 EKG 12-Lead    2. Mild mitral regurgitation  I34.0     3. Sleep apnea in adult  G47.30     4. Hypercholesteremia  E78.00        Recommendation(s):  Benign hypertension Office blood pressures are acceptable. Medications reconciled. Continue amlodipine 10 mg p.o. every afternoon. Continue Diovan 320 mg p.o. every morning. Continue hydralazine 100 mg p.o. 3 times daily. I did inform the patient that hydralazine can cause drug-induced lupus.  In her autoimmune workup if there is concern for lupus or  other autoimmune pathology it would be very reasonable to transition her from hydralazine to another antihypertensive medication.  She will talk to her rheumatologist and send this message if this is applicable.  Mild mitral regurgitation Asymptomatic. Noted on prior echocardiogram. Will monitor her clinically.  Sleep apnea in adult Reemphasized importance of device therapy.  Hypercholesteremia LDL levels were not at goal back in October 2023. No repeat fasting lipid profile based on the records available in Care Everywhere. Currently being worked up  for possible liver pathology as her enzymes have been elevated in the past. Recommended that she have her lipids rechecked by PCP. Continue to focus on reducing foods that are high in lipids to manage her disease medically. If she does not have any significant liver pathology and her lipids are elevated lipid-lowering agents could be considered.   Orders Placed:  Orders Placed This Encounter  Procedures   EKG 12-Lead   Final Medication List:   No orders of the defined types were placed in this encounter.   There are no discontinued medications.   Current Outpatient Medications:    amLODipine (NORVASC) 10 MG tablet, TAKE ONE TABLET BY MOUTH DAILY AT 10PM, Disp: 90 tablet, Rfl: 0   carbamazepine (TEGRETOL XR) 200 MG 12 hr tablet, Take 200 mg by mouth 2 (two) times daily. , Disp: , Rfl:    cetirizine (ZYRTEC) 10 MG tablet, Take by mouth., Disp: , Rfl:    Cholecalciferol (VITAMIN D-3) 25 MCG (1000 UT) CAPS, Take 1 capsule by mouth daily at 12 noon., Disp: , Rfl:    ferrous sulfate 325 (65 FE) MG EC tablet, Take 325 mg by mouth 2 (two) times daily., Disp: , Rfl:    fluocinonide (LIDEX) 0.05 % external solution, Apply 1 application topically as needed., Disp: , Rfl:    hydrALAZINE (APRESOLINE) 100 MG tablet, Take 1 tablet (100 mg total) by mouth 3 (three) times daily., Disp: 270 tablet, Rfl: 3   Magnesium 400 MG CAPS, Take 1 capsule by  mouth at bedtime., Disp: , Rfl:    Multiple Vitamin (MULTIVITAMIN PO), Take 1 tablet by mouth daily at 12 noon. Alive Women, Disp: , Rfl:    Multiple Vitamins-Minerals (HAIR/SKIN/NAILS/BIOTIN) TABS, Take 1 tablet by mouth daily at 12 noon., Disp: , Rfl:    RESTASIS 0.05 % ophthalmic emulsion, Place 1 drop into both eyes in the morning and at bedtime., Disp: , Rfl:    tiZANidine (ZANAFLEX) 2 MG tablet, Take 1 tablet by mouth as needed., Disp: , Rfl:    valsartan (DIOVAN) 320 MG tablet, Take 1 tablet (320 mg total) by mouth every morning., Disp: 90 tablet, Rfl: 1  Consent:   NA  Disposition:    1 year  Patient may be asked to follow-up sooner based on the results of the above-mentioned testing.  Her questions and concerns were addressed to her satisfaction. She voices understanding of the recommendations provided during this encounter.    Signed, Tessa Lerner, DO, [facility]  [facility]  [address] [place], [place] [address] 08/07/2023 6:31 PM

## 2023-08-07 NOTE — Patient Instructions (Signed)

## 2023-08-16 ENCOUNTER — Ambulatory Visit: Payer: Self-pay | Admitting: Cardiology

## 2023-08-19 ENCOUNTER — Encounter (HOSPITAL_BASED_OUTPATIENT_CLINIC_OR_DEPARTMENT_OTHER): Payer: Self-pay | Admitting: Emergency Medicine

## 2023-08-19 ENCOUNTER — Other Ambulatory Visit: Payer: Self-pay

## 2023-08-19 ENCOUNTER — Emergency Department (HOSPITAL_BASED_OUTPATIENT_CLINIC_OR_DEPARTMENT_OTHER): Payer: Medicare Other

## 2023-08-19 ENCOUNTER — Emergency Department (HOSPITAL_BASED_OUTPATIENT_CLINIC_OR_DEPARTMENT_OTHER)
Admission: EM | Admit: 2023-08-19 | Discharge: 2023-08-19 | Disposition: A | Discharge status: Home / Self Care | Payer: Medicare Other | Attending: Emergency Medicine | Admitting: Emergency Medicine

## 2023-08-19 DIAGNOSIS — I129 Hypertensive chronic kidney disease with stage 1 through stage 4 chronic kidney disease, or unspecified chronic kidney disease: Secondary | ICD-10-CM | POA: Diagnosis not present

## 2023-08-19 DIAGNOSIS — Z79899 Other long term (current) drug therapy: Secondary | ICD-10-CM | POA: Diagnosis not present

## 2023-08-19 DIAGNOSIS — W1839XA Other fall on same level, initial encounter: Secondary | ICD-10-CM | POA: Diagnosis not present

## 2023-08-19 DIAGNOSIS — N189 Chronic kidney disease, unspecified: Secondary | ICD-10-CM | POA: Insufficient documentation

## 2023-08-19 DIAGNOSIS — M62830 Muscle spasm of back: Secondary | ICD-10-CM | POA: Insufficient documentation

## 2023-08-19 DIAGNOSIS — R519 Headache, unspecified: Secondary | ICD-10-CM | POA: Diagnosis not present

## 2023-08-19 DIAGNOSIS — S93401A Sprain of unspecified ligament of right ankle, initial encounter: Secondary | ICD-10-CM | POA: Insufficient documentation

## 2023-08-19 DIAGNOSIS — W19XXXA Unspecified fall, initial encounter: Secondary | ICD-10-CM

## 2023-08-19 DIAGNOSIS — M25571 Pain in right ankle and joints of right foot: Secondary | ICD-10-CM | POA: Diagnosis present

## 2023-08-19 DIAGNOSIS — M549 Dorsalgia, unspecified: Secondary | ICD-10-CM

## 2023-08-19 MED ORDER — LIDOCAINE 5 % EX PTCH: 1.0000 | MEDICATED_PATCH | CUTANEOUS | 0 refills | Status: AC

## 2023-08-19 MED ORDER — TRAMADOL HCL 50 MG PO TABS: 50.0000 mg | ORAL_TABLET | Freq: Four times a day (QID) | ORAL | 0 refills | Status: AC | PRN

## 2023-08-19 NOTE — Discharge Instructions (Signed)
Your history, exam, and evaluation are consistent with sprain of the right ankle during her fall.  Also suspect you have some muscle spasm in your left upper back.  We had a shared decision-making conversation and agreed to hold on extensive workup with advanced imaging or labs at this time please use the walking boot and crutches and use over-the-counter medication to help.  Please rest and stay hydrated follow-up with your primary doctor.  If any symptoms change or worsen acutely, return to the nearest emergency department.

## 2023-08-19 NOTE — ED Triage Notes (Signed)
Pt feels her BP is "sky high"; taking medications as directed; c/o HA today

## 2023-08-19 NOTE — ED Provider Notes (Addendum)
[facility] AT [facility] Provider Note   CSN: [id number] Arrival date & time: 08/19/23  1544     History  Chief Complaint  Patient presents with   Headache    [name] is a 67 y.o. female.  The history is provided by the patient and medical records. No language interpreter was used.  Headache Associated symptoms: back pain (left upper back)   Associated symptoms: no abdominal pain, no congestion, no cough, no diarrhea, no dizziness, no fatigue, no fever, no nausea, no neck pain, no neck stiffness, no numbness, no vomiting and no weakness   Fall This is a new problem. The current episode started less than 1 hour ago. The problem occurs constantly. The problem has not changed since onset.Associated symptoms include headaches (resolved now but HA initially). Pertinent negatives include no chest pain, no abdominal pain and no shortness of breath. Nothing aggravates the symptoms. Nothing relieves the symptoms. She has tried nothing for the symptoms. The treatment provided no relief.       Home Medications Prior to Admission medications   Medication Sig Start Date End Date Taking? Authorizing Provider  amLODipine (NORVASC) 10 MG tablet TAKE ONE TABLET BY MOUTH DAILY AT 10PM 07/30/23   Tolia, Sunit, DO  carbamazepine (TEGRETOL XR) 200 MG 12 hr tablet Take 200 mg by mouth 2 (two) times daily.     [provider]  cetirizine (ZYRTEC) 10 MG tablet Take by mouth.    [provider]  Cholecalciferol (VITAMIN D-3) 25 MCG (1000 UT) CAPS Take 1 capsule by mouth daily at 12 noon.    [provider]  ferrous sulfate 325 (65 FE) MG EC tablet Take 325 mg by mouth 2 (two) times daily.    [provider]  fluocinonide (LIDEX) 0.05 % external solution Apply 1 application topically as needed.    [provider]  hydrALAZINE (APRESOLINE) 100 MG tablet Take 1 tablet (100 mg total) by mouth 3 (three) times daily. 04/25/23    Tolia, Sunit, DO  Magnesium 400 MG CAPS Take 1 capsule by mouth at bedtime.    [provider]  Multiple Vitamin (MULTIVITAMIN PO) Take 1 tablet by mouth daily at 12 noon. Alive Women    [provider]  Multiple Vitamins-Minerals (HAIR/SKIN/NAILS/BIOTIN) TABS Take 1 tablet by mouth daily at 12 noon.    [provider]  RESTASIS 0.05 % ophthalmic emulsion Place 1 drop into both eyes in the morning and at bedtime. 11/24/21   [provider]  tiZANidine (ZANAFLEX) 2 MG tablet Take 1 tablet by mouth as needed. 06/10/21   [provider]  valsartan (DIOVAN) 320 MG tablet Take 1 tablet (320 mg total) by mouth every morning. 04/25/23   Tolia, Sunit, DO      Allergies    Codeine, Dilantin [phenytoin sodium extended], Librium [chlordiazepoxide hcl], and Tylenol [acetaminophen]    Review of Systems   Review of Systems  Constitutional:  Negative for chills, fatigue and fever.  HENT:  Negative for congestion.   Respiratory:  Negative for cough, chest tightness and shortness of breath.   Cardiovascular:  Negative for chest pain.  Gastrointestinal:  Negative for abdominal pain, constipation, diarrhea, nausea and vomiting.  Genitourinary:  Negative for dysuria.  Musculoskeletal:  Positive for back pain (left upper back). Negative for neck pain and neck stiffness.  Skin:  Negative for rash and wound.  Neurological:  Positive for headaches (resolved now but HA initially). Negative for dizziness,  weakness, light-headedness and numbness.  Psychiatric/Behavioral:  Negative for agitation.   All other systems reviewed and are negative.   Physical Exam Updated Vital Signs BP (!) 150/76 (BP Location: Left Arm)   Pulse 83   Temp 98.2 F (36.8 C) (Oral)   Resp 16   Ht 5\' 6"  (1.676 m)   Wt 81.2 kg   SpO2 99%   BMI 28.89 kg/m  Physical Exam Vitals and nursing note reviewed.  Constitutional:      General: She is not in acute distress.    Appearance: She is  well-developed. She is not diaphoretic.  HENT:     Head: Normocephalic and atraumatic.     Right Ear: External ear normal.     Left Ear: External ear normal.     Nose: Nose normal. No congestion or rhinorrhea.     Mouth/Throat:     Pharynx: No oropharyngeal exudate.  Eyes:     Extraocular Movements: Extraocular movements intact.     Conjunctiva/sclera: Conjunctivae normal.     Pupils: Pupils are equal, round, and reactive to light.  Cardiovascular:     Rate and Rhythm: Normal rate.     Pulses: Normal pulses.  Pulmonary:     Effort: Pulmonary effort is normal. No respiratory distress.     Breath sounds: No stridor. No wheezing, rhonchi or rales.  Chest:     Chest wall: No tenderness.  Abdominal:     General: Abdomen is flat. There is no distension.     Tenderness: There is no abdominal tenderness. There is no right CVA tenderness, left CVA tenderness, guarding or rebound.  Musculoskeletal:        General: Swelling and tenderness present.       Arms:     Cervical back: Normal range of motion and neck supple. No tenderness.     Right lower leg: No edema.     Left lower leg: No edema.     Right ankle: Swelling present. Tenderness present.       Legs:     Comments: Tenderness in left upper back with some muscle spasm and tenderness.  No laceration seen.  Normal exam of the arms.  Shoulder not focally tender itself.  Tender in the right ankle both medially and laterally.  No laceration.  Some swelling.  Intact sensation strength and pulses.  Skin:    General: Skin is warm.     Coloration: Skin is not pale.     Findings: No erythema or rash.  Neurological:     General: No focal deficit present.     Mental Status: She is alert and oriented to person, place, and time.     Sensory: No sensory deficit.     Motor: No weakness or abnormal muscle tone.     Coordination: Coordination normal.     Deep Tendon Reflexes: Reflexes are normal and symmetric.     ED Results / Procedures /  Treatments   Labs (all labs ordered are listed, but only abnormal results are displayed) Labs Reviewed - No data to display  EKG None  Radiology DG Ankle Complete Right  Result Date: 08/19/2023 CLINICAL DATA:  Fall. Ankle pain. Pain is more severe along the lateral ankle. EXAM: RIGHT ANKLE - COMPLETE 3+ VIEW COMPARISON:  None Available. FINDINGS: There are no signs of acute fracture or dislocation. Degenerative changes between the tarsal bones. Plantar heel spur. IMPRESSION: 1. No acute findings. 2. Plantar heel spur. Electronically Signed   By: Ladona Ridgel  Bradly Chris M.D.   On: 08/19/2023 17:00    Procedures Procedures    Medications Ordered in ED Medications - No data to display  ED Course/ Medical Decision Making/ A&P                                 Medical Decision Making Amount and/or Complexity of Data Reviewed Radiology: ordered.  Risk Prescription drug management.    [name] is a 67 y.o. female with a past medical history significant for CKD, migraines, seizures, conduct disorder, hypertension, and Raynaud's who presents with fall, headache, left shoulder discomfort, and right ankle pain.  According to patient, she was helping her family walk when they started to fall and then she fell to the ground as well.  She reports hurting her right ankle but it started to swell and her left shoulder on the backside.  She reports she did not hit her head and this reporting that her initial headache has resolved.  Denies any neck pain or neck stiffness.  Denies any chest abdominal or back pain.  Denies any pain in her arms.  On exam, lungs clear.  Chest nontender.  Abdomen nontender.  No tenderness of the hips.  No tenderness of the knees.  Patient has tenderness in the right ankle both in the medial and lateral side.  Intact pulses, strength, and sensation.  Patient could walk.  Good strength in her arms.  Symmetric smile.  Clear speech.  Pupils symmetric and reactive with normal  extract movements.  Neck not focally tender.  Head nontender.  No laceration or contusion seen to the head.  Patient has some tenderness on her left upper back near her shoulder but shoulder was not focally tender itself.  Arm exam remarkable.  Blood pressure was in the 150s and 160s systolic and patient thinks this is slightly higher than normal but she attributes that to the pain and having to be in the emergency department.  We had a shared decision-making conversation offering a CT of the head but she does not want this.  She was amenable to getting least an x-ray of the ankle to look for fracture but I have low suspicion and suspect this is more of a sprain.  Patient does not want any medication because she is driving and does not want more extensive workup at this time.  Will get x-ray and if this is reassuring, dissipate ASO and plan for discharge home and close follow-up.  X-ray did not show fracture.  Patient was able to ambulate while limping.  Will place in cam walker and give crutches and will send in prescription for some pain medicine and Lidoderm patches as I do suspect a sprain and muscle spasm.  Patient agrees with outpatient PCP follow-up and return precautions.  She no other questions or concerns and was discharged in good condition.         Final Clinical Impression(s) / ED Diagnoses Final diagnoses:  Fall, initial encounter  Acute right ankle pain  Sprain of right ankle, unspecified ligament, initial encounter  Upper back pain on left side  Muscle spasm of back    Rx / DC Orders ED Discharge Orders          Ordered    traMADol (ULTRAM) 50 MG tablet  Every 6 hours PRN        08/19/23 1744    lidocaine (LIDODERM) 5 %  Every 24  hours        08/19/23 1744            Clinical Impression: 1. Fall, initial encounter   2. Acute right ankle pain   3. Sprain of right ankle, unspecified ligament, initial encounter   4. Upper back pain on left side   5. Muscle  spasm of back     Disposition: Discharge  Condition: Good  I have discussed the results, Dx and Tx plan with the pt(& family if present). He/she/they expressed understanding and agree(s) with the plan. Discharge instructions discussed at great length. Strict return precautions discussed and pt &/or family have verbalized understanding of the instructions. No further questions at time of discharge.    Discharge Medication List as of 08/19/2023  5:44 PM     START taking these medications   Details  lidocaine (LIDODERM) 5 % Place 1 patch onto the skin daily. Remove & Discard patch within 12 hours or as directed by MD, Starting Sun 08/19/2023, Normal    traMADol (ULTRAM) 50 MG tablet Take 1 tablet (50 mg total) by mouth every 6 (six) hours as needed., Starting Sun 08/19/2023, Normal        Follow Up: Verlon Au, MD [address] [place] [address] [phone]     [facility] Emergency Department at [facility] [address] [place] [address] [phone]        Robertta Halfhill, Canary Brim, MD 08/19/23 1745    Itzy Adler, Canary Brim, MD 08/19/23 1746

## 2023-11-14 ENCOUNTER — Other Ambulatory Visit: Payer: Self-pay | Admitting: Cardiology

## 2023-11-14 DIAGNOSIS — I10 Essential (primary) hypertension: Secondary | ICD-10-CM

## 2024-05-08 ENCOUNTER — Other Ambulatory Visit: Payer: Self-pay | Admitting: Cardiology

## 2024-05-08 DIAGNOSIS — I10 Essential (primary) hypertension: Secondary | ICD-10-CM

## 2024-07-15 ENCOUNTER — Other Ambulatory Visit: Payer: Self-pay | Admitting: Cardiology

## 2024-07-15 ENCOUNTER — Telehealth: Payer: Self-pay | Admitting: Cardiology

## 2024-07-15 DIAGNOSIS — I10 Essential (primary) hypertension: Secondary | ICD-10-CM

## 2024-07-15 MED ORDER — HYDRALAZINE HCL 100 MG PO TABS: 100.0000 mg | ORAL_TABLET | Freq: Three times a day (TID) | ORAL | 0 refills | Status: AC

## 2024-07-15 MED ORDER — VALSARTAN 320 MG PO TABS: 320.0000 mg | ORAL_TABLET | Freq: Every morning | ORAL | 0 refills | Status: AC

## 2024-07-15 NOTE — Telephone Encounter (Signed)
*  STAT* If patient is at the pharmacy, call can be transferred to refill team.   1. Which medications need to be refilled? (please list name of each medication and dose if known) hydrALAZINE  (APRESOLINE ) 100 MG tablet  valsartan  (DIOVAN ) 320 MG tablet   2. Would you like to learn more about the convenience, safety, & potential cost savings by using the [facility]? No   3. Are you open to using the [facility] (Type [facility].) No   4. Which pharmacy/location (including street and city if local pharmacy) is medication to be sent to?  [facility] - [place], [place] - [address]   5. Do they need a 30 day or 90 day supply? 90 day  Pt has scheduled appt on 12/3

## 2024-07-15 NOTE — Telephone Encounter (Signed)
 Pt's medications were sent to pt's pharmacy as requested. Confirmation received.

## 2024-08-13 ENCOUNTER — Ambulatory Visit: Attending: Cardiology | Admitting: Cardiology

## 2024-08-13 ENCOUNTER — Encounter: Payer: Self-pay | Admitting: Cardiology

## 2024-08-13 VITALS — BP 136/64 | HR 69 | Resp 16 | Ht 66.0 in | Wt 183.0 lb

## 2024-08-13 DIAGNOSIS — E7800 Pure hypercholesterolemia, unspecified: Secondary | ICD-10-CM | POA: Diagnosis not present

## 2024-08-13 DIAGNOSIS — I10 Essential (primary) hypertension: Secondary | ICD-10-CM | POA: Diagnosis not present

## 2024-08-13 DIAGNOSIS — I351 Nonrheumatic aortic (valve) insufficiency: Secondary | ICD-10-CM | POA: Diagnosis not present

## 2024-08-13 NOTE — Patient Instructions (Signed)
 Medication Instructions:  No Changes *If you need a refill on your cardiac medications before your next appointment, please call your pharmacy*  Lab Work: None  Testing/Procedures: Your physician has requested that you have an echocardiogram, next available appointment. Echocardiography is a painless test that uses sound waves to create images of your heart. It provides your doctor with information about the size and shape of your heart and how well your heart's chambers and valves are working. This procedure takes approximately one hour. There are no restrictions for this procedure. Please do NOT wear cologne, perfume, aftershave, or lotions (deodorant is allowed). Please arrive 15 minutes prior to your appointment time.  Please note: We ask at that you not bring children with you during ultrasound (echo/ vascular) testing. Due to room size and safety concerns, children are not allowed in the ultrasound rooms during exams. Our front office staff cannot provide observation of children in our lobby area while testing is being conducted. An adult accompanying a patient to their appointment will only be allowed in the ultrasound room at the discretion of the ultrasound technician under special circumstances. We apologize for any inconvenience.   Follow-Up: At [facility], you and your health needs are our priority.  As part of our continuing mission to provide you with exceptional heart care, our providers are all part of one team.  This team includes your primary Cardiologist (physician) and Advanced Practice Providers or APPs (Physician Assistants and Nurse Practitioners) who all work together to provide you with the care you need, when you need it.  Your next appointment:   1 year(s)  Provider:   Madonna Large, DO

## 2024-08-13 NOTE — Progress Notes (Signed)
 Cardiology Office Note:  .   Date:  08/13/2024  ID:  [name], DOB July 11, 1956, MRN [id number] PCP:  Jolee Madelin Patch, MD  Former Cardiology Providers: Sherran Berliner, PA [facility] Providers Cardiologist:  Madonna Large, DO , [facility] (established care August 2022) Electrophysiologist:  None  Click to update primary MD,subspecialty MD or APP then REFRESH:1}    Chief Complaint  Patient presents with   Follow-up    1 year follow up - hx of near syncope / HTN    History of Present Illness: [name] [name] is a 68 y.o. African-American female whose past medical history and cardiovascular risk factors includes: Former smoker, hypertension.   Patient was initially referred to me in 2022 for near syncope.  It was felt that her near-syncopal event was secondary to over correcting of her blood pressures.  Her blood pressure medications were titrated and she has not had any reoccurrence of near-syncope or syncopal events.  She presents today for 1 year follow-up visit. She denies anginal chest pain or heart failure symptoms.  Overall functional capacity remains relatively stable. Patient follows up with rheumatology and hepatology. Still not on lipid-lowering agents despite the cholesterol levels that she has had in the past.  Patient did not do well with chlorthalidone  in the past as it caused hyponatremia and hypokalemia.   Review of Systems: .   Review of Systems  Cardiovascular:  Negative for chest pain, claudication, irregular heartbeat, leg swelling, near-syncope, orthopnea, palpitations, paroxysmal nocturnal dyspnea and syncope.  Respiratory:  Negative for shortness of breath.   Hematologic/Lymphatic: Negative for bleeding problem.    Studies Reviewed:   EKG: EKG Interpretation Date/Time:  Wednesday August 13 2024 08:03:46 EST Text Interpretation: Sinus rhythm with sinus arrhythmia with 1st degree A-V block When compared with ECG of 07-Aug-2023 08:43, No  significant change was found Confirmed by Large Madonna ([id number]) on 08/13/2024 8:19:45 AM  Echocardiogram: September 2022: LVEF 55 to 60%, mild AR, mild MR, mild TR, see report for additional details  Stress Testing: Exercise treadmill stress test September 2022: Abnormal GXT further testing recommended.  Exercise nuclear stress test November 2022: Normal myocardial perfusion, low risk study  Heart Catheterization: 11/19 2003 by Dr. Victory Sharps III at [facility]:  1. Essentially normal coronary arteries.  2. Normal left ventricular function.  RADIOLOGY: NA  Risk Assessment/Calculations:   NA   Labs:        No data to display             Latest Ref Rng & Units 10/27/2021    3:14 PM 10/19/2021    2:41 PM 10/11/2021    9:40 AM  BMP  Glucose 70 - 99 mg/dL 97  882  95   BUN 8 - 27 mg/dL 13  19  14    Creatinine 0.57 - 1.00 mg/dL 8.83  8.80  8.77   BUN/Creat Ratio 12 - 28 11  16  11    Sodium 134 - 144 mmol/L 134  126  137   Potassium 3.5 - 5.2 mmol/L 3.7  3.3  CANCELED   Chloride 96 - 106 mmol/L 95  88  102   CO2 20 - 29 mmol/L 25  24  16    Calcium 8.7 - 10.3 mg/dL 9.1  8.6  9.1       Latest Ref Rng & Units 10/27/2021    3:14 PM 10/19/2021    2:41 PM 10/11/2021    9:40 AM  CMP  Glucose 70 - 99 mg/dL 97  882  95   BUN 8 - 27 mg/dL 13  19  14    Creatinine 0.57 - 1.00 mg/dL 8.83  8.80  8.77   Sodium 134 - 144 mmol/L 134  126  137   Potassium 3.5 - 5.2 mmol/L 3.7  3.3  CANCELED   Chloride 96 - 106 mmol/L 95  88  102   CO2 20 - 29 mmol/L 25  24  16    Calcium 8.7 - 10.3 mg/dL 9.1  8.6  9.1     No results found for: CHOL, HDL, LDLCALC, LDLDIRECT, TRIG, CHOLHDL No results for input(s): LIPOA in the last 8760 hours. No components found for: NTPROBNP No results for input(s): PROBNP in the last 8760 hours. No results for input(s): TSH in the last 8760 hours.  06/24/2020: Total cholesterol 205, triglycerides 84, HDL 62, LDL 126, non-HDL  856. Collected: July 06, 2022. Total cholesterol 204, triglycerides 66, HDL 67, LDL calculated 124 mg/dL, non-HDL 862  Physical Exam:    Today's Vitals   08/13/24 0801  BP: 136/64  Pulse: 69  Resp: 16  SpO2: 96%  Weight: 183 lb (83 kg)  Height: 5' 6 (1.676 m)   Body mass index is 29.54 kg/m. Wt Readings from Last 3 Encounters:  08/13/24 183 lb (83 kg)  08/19/23 179 lb (81.2 kg)  08/07/23 179 lb 12.8 oz (81.6 kg)    Physical Exam  Constitutional: No distress.  Age appropriate, hemodynamically stable.   Neck: No JVD present.  Cardiovascular: Normal rate, regular rhythm, S1 normal, S2 normal, intact distal pulses and normal pulses. Exam reveals no gallop, no S3 and no S4.  No murmur heard. Pulmonary/Chest: Effort normal and breath sounds normal. No stridor. She has no wheezes. She has no rales.  Abdominal: Soft. Bowel sounds are normal. She exhibits no distension. There is no abdominal tenderness.  Musculoskeletal:        General: No edema.     Cervical back: Neck supple.  Neurological: She is alert and oriented to person, place, and time. She has intact cranial nerves (2-12).  Skin: Skin is warm and moist.     Impression & Recommendation(s):  Impression:   ICD-10-CM   1. Benign hypertension  I10 EKG 12-Lead    2. Nonrheumatic aortic valve insufficiency  I35.1 ECHOCARDIOGRAM COMPLETE    3. Hypercholesteremia  E78.00        Recommendation(s):  Benign hypertension Office blood pressures are acceptable. Medications reconciled. Continue amlodipine  10 mg p.o. every afternoon. Continue Diovan  320 mg p.o. every morning. Continue hydralazine  100 mg p.o. twice daily Reemphasized importance of low-salt diet.  Aortic regurgitation Asymptomatic. Plan echocardiogram is a 3-year follow-up study.  Sleep apnea in adult Reemphasized importance of device therapy.  Hypercholesteremia No recent lipid labs available for review. Patient has a yearly physical with her PCP  coming up on 08/28/2024. If cholesterol levels are still elevated would recommend considering pharmacological therapy.   We also discussed the role of coronary calcium score for further risk stratification. Patient is inclined to focus on lifestyle modification and losing weight for now.   She will discuss with PCP at her upcoming yearly physical. Will defer lipid management to PCP for now. Would recommend close follow-up with follow-up LFTs to make sure alkaline phosphatase/AST/ALT are well-controlled if lipid-lowering agents are initiated.  Orders Placed:  Orders Placed This Encounter  Procedures   EKG 12-Lead   ECHOCARDIOGRAM COMPLETE    Standing  Status:   Future    Expiration Date:   08/13/2025    Where [facility] performed[facility]    Does the patient weigh less than or greater than 250 lbs?:   Patient weighs less than 250 lbs    Perflutren DEFINITY (image enhancing agent) should be administered unless hypersensitivity or allergy exist:   Administer Perflutren    Reason for exam-Echo:   Other-Full Diagnosis List    Full ICD-10/Reason for Exam:   Aortic regurgitation [809152]   Final Medication List:   No orders of the defined types were placed in this encounter.   There are no discontinued medications.   Current Outpatient Medications:    amLODipine  (NORVASC ) 10 MG tablet, TAKE 1 TABLET (10 MG TOTAL) BY MOUTH DAILY AT 10 IN THE EVENING, Disp: 90 tablet, Rfl: 0   carbamazepine  (TEGRETOL  XR) 200 MG 12 hr tablet, Take 200 mg by mouth 2 (two) times daily. , Disp: , Rfl:    cetirizine (ZYRTEC) 10 MG tablet, Take by mouth., Disp: , Rfl:    Cholecalciferol (VITAMIN D-3) 25 MCG (1000 UT) CAPS, Take 1 capsule by mouth daily at 12 noon., Disp: , Rfl:    ferrous sulfate 325 (65 FE) MG EC tablet, Take 325 mg by mouth 2 (two) times daily., Disp: , Rfl:    fluocinonide (LIDEX) 0.05 % external solution, Apply 1 application topically as needed., Disp: , Rfl:     hydrALAZINE  (APRESOLINE ) 100 MG tablet, Take 1 tablet (100 mg total) by mouth 3 (three) times daily., Disp: 270 tablet, Rfl: 0   lidocaine  (LIDODERM ) 5 %, Place 1 patch onto the skin daily. Remove & Discard patch within 12 hours or as directed by MD, Disp: 15 patch, Rfl: 0   Magnesium 400 MG CAPS, Take 1 capsule by mouth at bedtime., Disp: , Rfl:    methylPREDNISolone (MEDROL DOSEPAK) 4 MG TBPK tablet, Take 4 mg by mouth See admin instructions. see package, Disp: , Rfl:    Multiple Vitamin (MULTIVITAMIN PO), Take 1 tablet by mouth daily at 12 noon. Alive Women, Disp: , Rfl:    Multiple Vitamins-Minerals (HAIR/SKIN/NAILS/BIOTIN) TABS, Take 1 tablet by mouth daily at 12 noon., Disp: , Rfl:    RESTASIS 0.05 % ophthalmic emulsion, Place 1 drop into both eyes in the morning and at bedtime., Disp: , Rfl:    tiZANidine (ZANAFLEX) 2 MG tablet, Take 1 tablet by mouth as needed., Disp: , Rfl:    traMADol  (ULTRAM ) 50 MG tablet, Take 1 tablet (50 mg total) by mouth every 6 (six) hours as needed., Disp: 15 tablet, Rfl: 0   valsartan  (DIOVAN ) 320 MG tablet, Take 1 tablet (320 mg total) by mouth every morning., Disp: 90 tablet, Rfl: 0  Consent:   NA  Disposition:   1 year follow-up or as needed.   Patient favors annual follow-up visits for now.    Her questions and concerns were addressed to her satisfaction. She voices understanding of the recommendations provided during this encounter.    Signed, Madonna Michele HAS, [facility]  A Division of [facility] [place] [address] [place], [place] [address]   08/13/2024 8:33 AM

## 2024-08-20 ENCOUNTER — Encounter: Payer: Self-pay | Admitting: Podiatry

## 2024-08-20 ENCOUNTER — Ambulatory Visit: Admitting: Podiatry

## 2024-08-20 ENCOUNTER — Ambulatory Visit (INDEPENDENT_AMBULATORY_CARE_PROVIDER_SITE_OTHER)

## 2024-08-20 DIAGNOSIS — M7751 Other enthesopathy of right foot: Secondary | ICD-10-CM | POA: Diagnosis not present

## 2024-08-20 DIAGNOSIS — G629 Polyneuropathy, unspecified: Secondary | ICD-10-CM

## 2024-08-20 DIAGNOSIS — M778 Other enthesopathies, not elsewhere classified: Secondary | ICD-10-CM | POA: Diagnosis not present

## 2024-08-20 MED ORDER — DICLOFENAC SODIUM 75 MG PO TBEC: 75.0000 mg | DELAYED_RELEASE_TABLET | Freq: Two times a day (BID) | ORAL | 2 refills | Status: AC

## 2024-08-20 NOTE — Progress Notes (Signed)
 Subjective:   Patient ID: [name], female   DOB: 68 y.o.   MRN: [id number]   HPI Patient presents stating she has been getting a lot of numbness and tingling in her feet and she is wondering if there is anything that can be done.  patient does not smoke and likes to be active.  States that this has been going on for around a year   Review of Systems  All other systems reviewed and are negative.       Objective:  Physical Exam Vitals and nursing note reviewed.  Constitutional:      Appearance: She is well-developed.  Pulmonary:     Effort: Pulmonary effort is normal.  Musculoskeletal:        General: Normal range of motion.  Skin:    General: Skin is warm.  Neurological:     Mental Status: She is alert.     Vascular status was found to be intact neurologically there is mild diminishment sharp dull vibratory there is low-grade inflammation in both feet with discomfort that occurs and patient has tried wider shoes and stretch.  Patient has good digital perfusion well-oriented and was treated for plantar fasciitis 7 years ago     Assessment:  Inflammatory condition with possibility of neuropathic condition with both problems occurring     Plan:  H&P reviewed and discussed.  We reviewed different options available at this point organ to try oral anti-inflammatories see the results of may have to switch to something more for neuropathic like discomfort.  Patient will also increase stretch wear good shoes and be seen back to recheck  X-rays did not indicate signs of fracture mild arthritis noted

## 2024-09-18 ENCOUNTER — Ambulatory Visit (HOSPITAL_COMMUNITY)
Admission: RE | Admit: 2024-09-18 | Discharge: 2024-09-18 | Disposition: A | Discharge status: Home / Self Care | Source: Ambulatory Visit | Attending: Cardiovascular Disease | Admitting: Cardiovascular Disease

## 2024-09-18 DIAGNOSIS — I351 Nonrheumatic aortic (valve) insufficiency: Secondary | ICD-10-CM | POA: Diagnosis present

## 2024-09-18 LAB — ECHOCARDIOGRAM COMPLETE
Area-P 1/2: 3.72 cm2
S' Lateral: 2.5 cm

## 2024-09-24 ENCOUNTER — Ambulatory Visit: Admitting: Podiatry

## 2024-09-24 ENCOUNTER — Encounter: Payer: Self-pay | Admitting: Podiatry

## 2024-09-24 VITALS — Ht 66.0 in | Wt 183.0 lb

## 2024-09-24 DIAGNOSIS — M778 Other enthesopathies, not elsewhere classified: Secondary | ICD-10-CM

## 2024-09-24 DIAGNOSIS — G629 Polyneuropathy, unspecified: Secondary | ICD-10-CM | POA: Diagnosis not present

## 2024-09-24 NOTE — Progress Notes (Signed)
 Subjective:   Patient ID: [name], female   DOB: 69 y.o.   MRN: [id number]   HPI Patient states I do think I have improved some from taking the medication but I still get pain it is not as intense as it was   ROS      Objective:  Physical Exam  Neurovascular status intact patient still is getting some burning pain and inflammation and fluid around the MPJs but improved with oral antiinflammatory agent     Assessment:  Still difficult to tell between inflammation or possibility of neuropathy     Plan:  H&P reviewed condition and at this point we are going to continue to keep her on an anti-inflammatory but reduce it over the next few weeks she will start 1 a day and then 1 every other day and stop this in about 4 weeks.  May require gabapentin if symptoms were to get bad again or if she were to have continuing tingling numbness type sensation.  Hopefully we can avoid that she will just continue to respond to oral anti-inflammatory

## 2024-09-25 ENCOUNTER — Ambulatory Visit: Payer: Self-pay | Admitting: Cardiology
